# Patient Record
Sex: Male | Born: 1995 | Race: White | Hispanic: No | Marital: Single | Smoking: Current some day smoker
Health system: Southern US, Community
[De-identification: ages and names within clinical notes are randomized; demographics above are authoritative.]

## PROBLEM LIST (undated history)

## (undated) ENCOUNTER — Emergency Department (HOSPITAL_COMMUNITY): Payer: Self-pay

## (undated) DIAGNOSIS — T883XXA Malignant hyperthermia due to anesthesia, initial encounter: Secondary | ICD-10-CM

---
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
## 2015-11-07 ENCOUNTER — Emergency Department (HOSPITAL_COMMUNITY): Payer: Managed Care, Other (non HMO)

## 2015-11-07 ENCOUNTER — Inpatient Hospital Stay (HOSPITAL_COMMUNITY): Payer: Managed Care, Other (non HMO) | Admitting: Certified Registered"

## 2015-11-07 ENCOUNTER — Encounter (HOSPITAL_COMMUNITY): Payer: Self-pay | Admitting: Physical Medicine and Rehabilitation

## 2015-11-07 ENCOUNTER — Encounter (HOSPITAL_COMMUNITY)
Admission: EM | Disposition: A | Discharge status: Home Health | Payer: Self-pay | Source: Home / Self Care | Attending: Orthopedic Surgery

## 2015-11-07 ENCOUNTER — Inpatient Hospital Stay (HOSPITAL_COMMUNITY)
Admission: EM | Admit: 2015-11-07 | Discharge: 2015-11-13 | DRG: 481 | Disposition: A | Discharge status: Home Health | Payer: Managed Care, Other (non HMO) | Attending: Orthopedic Surgery | Admitting: Orthopedic Surgery

## 2015-11-07 DIAGNOSIS — T402X5A Adverse effect of other opioids, initial encounter: Secondary | ICD-10-CM | POA: Diagnosis not present

## 2015-11-07 DIAGNOSIS — W132XXA Fall from, out of or through roof, initial encounter: Secondary | ICD-10-CM | POA: Diagnosis present

## 2015-11-07 DIAGNOSIS — M25551 Pain in right hip: Secondary | ICD-10-CM | POA: Diagnosis present

## 2015-11-07 DIAGNOSIS — K5903 Drug induced constipation: Secondary | ICD-10-CM | POA: Diagnosis not present

## 2015-11-07 DIAGNOSIS — D62 Acute posthemorrhagic anemia: Secondary | ICD-10-CM | POA: Diagnosis not present

## 2015-11-07 DIAGNOSIS — S7221XA Displaced subtrochanteric fracture of right femur, initial encounter for closed fracture: Principal | ICD-10-CM | POA: Diagnosis present

## 2015-11-07 DIAGNOSIS — S72001G Fracture of unspecified part of neck of right femur, subsequent encounter for closed fracture with delayed healing: Secondary | ICD-10-CM

## 2015-11-07 DIAGNOSIS — Z01818 Encounter for other preprocedural examination: Secondary | ICD-10-CM

## 2015-11-07 DIAGNOSIS — F17210 Nicotine dependence, cigarettes, uncomplicated: Secondary | ICD-10-CM | POA: Diagnosis present

## 2015-11-07 DIAGNOSIS — W19XXXA Unspecified fall, initial encounter: Secondary | ICD-10-CM

## 2015-11-07 DIAGNOSIS — S7290XA Unspecified fracture of unspecified femur, initial encounter for closed fracture: Secondary | ICD-10-CM | POA: Diagnosis present

## 2015-11-07 DIAGNOSIS — Z23 Encounter for immunization: Secondary | ICD-10-CM

## 2015-11-07 DIAGNOSIS — M79604 Pain in right leg: Secondary | ICD-10-CM

## 2015-11-07 DIAGNOSIS — Z7409 Other reduced mobility: Secondary | ICD-10-CM

## 2015-11-07 DIAGNOSIS — R39198 Other difficulties with micturition: Secondary | ICD-10-CM | POA: Diagnosis not present

## 2015-11-07 DIAGNOSIS — S7291XA Unspecified fracture of right femur, initial encounter for closed fracture: Secondary | ICD-10-CM | POA: Diagnosis present

## 2015-11-07 HISTORY — PX: INTRAMEDULLARY (IM) NAIL INTERTROCHANTERIC: SHX5875

## 2015-11-07 HISTORY — DX: Malignant hyperthermia due to anesthesia, initial encounter: T88.3XXA

## 2015-11-07 LAB — CBC WITH DIFFERENTIAL/PLATELET
BASOS PCT: 0 %
Basophils Absolute: 0 10*3/uL (ref 0.0–0.1)
EOS ABS: 0.1 10*3/uL (ref 0.0–0.7)
EOS PCT: 1 %
HCT: 45.5 % (ref 39.0–52.0)
HEMOGLOBIN: 15.9 g/dL (ref 13.0–17.0)
Lymphocytes Relative: 33 %
Lymphs Abs: 3 10*3/uL (ref 0.7–4.0)
MCH: 31.2 pg (ref 26.0–34.0)
MCHC: 34.9 g/dL (ref 30.0–36.0)
MCV: 89.2 fL (ref 78.0–100.0)
MONO ABS: 0.6 10*3/uL (ref 0.1–1.0)
MONOS PCT: 7 %
NEUTROS PCT: 59 %
Neutro Abs: 5.3 10*3/uL (ref 1.7–7.7)
PLATELETS: 194 10*3/uL (ref 150–400)
RBC: 5.1 MIL/uL (ref 4.22–5.81)
RDW: 12.2 % (ref 11.5–15.5)
WBC: 9 10*3/uL (ref 4.0–10.5)

## 2015-11-07 LAB — PROTIME-INR
INR: 1.14
PROTHROMBIN TIME: 14.6 s (ref 11.4–15.2)

## 2015-11-07 LAB — COMPREHENSIVE METABOLIC PANEL
ALBUMIN: 4.2 g/dL (ref 3.5–5.0)
ALT: 20 U/L (ref 17–63)
ANION GAP: 9 (ref 5–15)
AST: 30 U/L (ref 15–41)
Alkaline Phosphatase: 50 U/L (ref 38–126)
BUN: 13 mg/dL (ref 6–20)
CHLORIDE: 106 mmol/L (ref 101–111)
CO2: 23 mmol/L (ref 22–32)
Calcium: 9.5 mg/dL (ref 8.9–10.3)
Creatinine, Ser: 0.91 mg/dL (ref 0.61–1.24)
GFR calc Af Amer: >60 mL/min (ref >60)
GFR calc non Af Amer: >60 mL/min (ref >60)
GLUCOSE: 103 mg/dL — AB (ref 65–99)
POTASSIUM: 3.8 mmol/L (ref 3.5–5.1)
SODIUM: 138 mmol/L (ref 135–145)
Total Bilirubin: 0.8 mg/dL (ref 0.3–1.2)
Total Protein: 6.8 g/dL (ref 6.5–8.1)

## 2015-11-07 LAB — I-STAT CG4 LACTIC ACID, ED
Lactic Acid, Venous: 2.26 mmol/L (ref 0.5–1.9)
Lactic Acid, Venous: 0.83 mmol/L (ref 0.5–1.9)

## 2015-11-07 SURGERY — FIXATION, FRACTURE, INTERTROCHANTERIC, WITH INTRAMEDULLARY ROD
Anesthesia: General | Site: Leg Upper | Laterality: Right

## 2015-11-07 MED ORDER — PHENYLEPHRINE 40 MCG/ML (10ML) SYRINGE FOR IV PUSH (FOR BLOOD PRESSURE SUPPORT)
PREFILLED_SYRINGE | INTRAVENOUS | Status: AC
  Filled 2015-11-07: qty 10

## 2015-11-07 MED ORDER — MIDAZOLAM HCL 2 MG/2ML IJ SOLN
INTRAMUSCULAR | Status: AC
  Filled 2015-11-07: qty 2

## 2015-11-07 MED ORDER — HYDROMORPHONE HCL 1 MG/ML IJ SOLN
1.0000 mg | INTRAMUSCULAR | Status: DC | PRN
  Administered 2015-11-07 (×2): 1 mg via INTRAVENOUS
  Filled 2015-11-07 (×2): qty 1

## 2015-11-07 MED ORDER — IOPAMIDOL (ISOVUE-300) INJECTION 61%
INTRAVENOUS | Status: AC
  Filled 2015-11-07: qty 100

## 2015-11-07 MED ORDER — ONDANSETRON HCL 4 MG PO TABS: 4.0000 mg | ORAL_TABLET | Freq: Four times a day (QID) | ORAL | Status: DC | PRN

## 2015-11-07 MED ORDER — FENTANYL CITRATE (PF) 100 MCG/2ML IJ SOLN
INTRAMUSCULAR | Status: DC | PRN
  Administered 2015-11-07: 100 ug via INTRAVENOUS
  Administered 2015-11-07 – 2015-11-08 (×6): 50 ug via INTRAVENOUS

## 2015-11-07 MED ORDER — ACETAMINOPHEN 10 MG/ML IV SOLN
INTRAVENOUS | Status: DC | PRN
  Administered 2015-11-07: 1000 mg via INTRAVENOUS

## 2015-11-07 MED ORDER — ONDANSETRON HCL 4 MG/2ML IJ SOLN
4.0000 mg | Freq: Once | INTRAMUSCULAR | Status: AC
  Administered 2015-11-07: 4 mg via INTRAVENOUS
  Filled 2015-11-07: qty 2

## 2015-11-07 MED ORDER — MAGNESIUM CITRATE PO SOLN: 1.0000 | Freq: Once | ORAL | Status: DC | PRN

## 2015-11-07 MED ORDER — FENTANYL CITRATE (PF) 100 MCG/2ML IJ SOLN
INTRAMUSCULAR | Status: AC
  Filled 2015-11-07: qty 2

## 2015-11-07 MED ORDER — FENTANYL CITRATE (PF) 100 MCG/2ML IJ SOLN
25.0000 ug | INTRAMUSCULAR | Status: DC | PRN
  Administered 2015-11-08: 50 ug via INTRAVENOUS

## 2015-11-07 MED ORDER — ACETAMINOPHEN 650 MG RE SUPP: 650.0000 mg | Freq: Four times a day (QID) | RECTAL | Status: DC | PRN

## 2015-11-07 MED ORDER — 0.9 % SODIUM CHLORIDE (POUR BTL) OPTIME
TOPICAL | Status: DC | PRN
  Administered 2015-11-07: 1000 mL

## 2015-11-07 MED ORDER — OXYCODONE HCL 5 MG PO TABS: 5.0000 mg | ORAL_TABLET | ORAL | Status: DC | PRN

## 2015-11-07 MED ORDER — ACETAMINOPHEN 10 MG/ML IV SOLN
INTRAVENOUS | Status: AC
  Filled 2015-11-07: qty 100

## 2015-11-07 MED ORDER — SUCCINYLCHOLINE CHLORIDE 200 MG/10ML IV SOSY
PREFILLED_SYRINGE | INTRAVENOUS | Status: AC
  Filled 2015-11-07: qty 10

## 2015-11-07 MED ORDER — PROPOFOL 500 MG/50ML IV EMUL
INTRAVENOUS | Status: DC | PRN
  Administered 2015-11-07: 125 ug/kg/min via INTRAVENOUS

## 2015-11-07 MED ORDER — ROCURONIUM BROMIDE 10 MG/ML (PF) SYRINGE
PREFILLED_SYRINGE | INTRAVENOUS | Status: AC
  Filled 2015-11-07: qty 10

## 2015-11-07 MED ORDER — LIDOCAINE HCL (CARDIAC) 20 MG/ML IV SOLN
INTRAVENOUS | Status: DC | PRN
  Administered 2015-11-07: 100 mg via INTRAVENOUS

## 2015-11-07 MED ORDER — CEFAZOLIN SODIUM-DEXTROSE 2-4 GM/100ML-% IV SOLN
2.0000 g | Freq: Once | INTRAVENOUS | Status: DC
  Filled 2015-11-07: qty 100

## 2015-11-07 MED ORDER — PROPOFOL 10 MG/ML IV BOLUS
INTRAVENOUS | Status: DC | PRN
  Administered 2015-11-07: 100 mg via INTRAVENOUS
  Administered 2015-11-07: 50 mg via INTRAVENOUS

## 2015-11-07 MED ORDER — PROMETHAZINE HCL 25 MG/ML IJ SOLN: 6.2500 mg | INTRAMUSCULAR | Status: DC | PRN

## 2015-11-07 MED ORDER — SUGAMMADEX SODIUM 200 MG/2ML IV SOLN
INTRAVENOUS | Status: AC
  Filled 2015-11-07: qty 2

## 2015-11-07 MED ORDER — LACTATED RINGERS IV SOLN
INTRAVENOUS | Status: DC | PRN
  Administered 2015-11-07 – 2015-11-08 (×3): via INTRAVENOUS

## 2015-11-07 MED ORDER — DOCUSATE SODIUM 100 MG PO CAPS: 100.0000 mg | ORAL_CAPSULE | Freq: Two times a day (BID) | ORAL | Status: DC

## 2015-11-07 MED ORDER — ONDANSETRON HCL 4 MG/2ML IJ SOLN: 4.0000 mg | Freq: Four times a day (QID) | INTRAMUSCULAR | Status: DC | PRN

## 2015-11-07 MED ORDER — EPHEDRINE 5 MG/ML INJ
INTRAVENOUS | Status: AC
  Filled 2015-11-07: qty 10

## 2015-11-07 MED ORDER — POLYETHYLENE GLYCOL 3350 17 G PO PACK: 17.0000 g | PACK | Freq: Every day | ORAL | Status: DC | PRN

## 2015-11-07 MED ORDER — ACETAMINOPHEN 325 MG PO TABS: 650.0000 mg | ORAL_TABLET | Freq: Four times a day (QID) | ORAL | Status: DC | PRN

## 2015-11-07 MED ORDER — ROCURONIUM BROMIDE 100 MG/10ML IV SOLN
INTRAVENOUS | Status: DC | PRN
  Administered 2015-11-07: 50 mg via INTRAVENOUS
  Administered 2015-11-07 – 2015-11-08 (×5): 10 mg via INTRAVENOUS

## 2015-11-07 MED ORDER — CEFAZOLIN SODIUM-DEXTROSE 2-3 GM-% IV SOLR
INTRAVENOUS | Status: DC | PRN
  Administered 2015-11-07: 2 g via INTRAVENOUS

## 2015-11-07 MED ORDER — BISACODYL 5 MG PO TBEC: 5.0000 mg | DELAYED_RELEASE_TABLET | Freq: Every day | ORAL | Status: DC | PRN

## 2015-11-07 MED ORDER — SODIUM CHLORIDE 0.9 % IV BOLUS (SEPSIS)
1000.0000 mL | Freq: Once | INTRAVENOUS | Status: AC
  Administered 2015-11-07: 1000 mL via INTRAVENOUS

## 2015-11-07 MED ORDER — MIDAZOLAM HCL 5 MG/5ML IJ SOLN
INTRAMUSCULAR | Status: DC | PRN
  Administered 2015-11-07: 2 mg via INTRAVENOUS

## 2015-11-07 MED ORDER — CEFAZOLIN SODIUM 1 G IJ SOLR
INTRAMUSCULAR | Status: AC
  Filled 2015-11-07: qty 20

## 2015-11-07 MED ORDER — ARTIFICIAL TEARS OP OINT
TOPICAL_OINTMENT | OPHTHALMIC | Status: AC
  Filled 2015-11-07: qty 3.5

## 2015-11-07 SURGICAL SUPPLY — 48 items
BIT DRILL SHORT 4.2 (BIT) ×1 IMPLANT
BLADE 15 SAFETY STRL DISP (BLADE) ×6 IMPLANT
COVER PERINEAL POST (MISCELLANEOUS) ×3 IMPLANT
COVER SURGICAL LIGHT HANDLE (MISCELLANEOUS) ×3 IMPLANT
DRAPE STERI IOBAN 125X83 (DRAPES) ×3 IMPLANT
DRAPE U-SHAPE 47X51 STRL (DRAPES) ×6 IMPLANT
DRILL BIT SHORT 4.2 (BIT) ×2
DRSG MEPILEX BORDER 4X4 (GAUZE/BANDAGES/DRESSINGS) ×6 IMPLANT
DRSG MEPILEX BORDER 4X8 (GAUZE/BANDAGES/DRESSINGS) ×3 IMPLANT
ELECT PENCIL ROCKER SW 15FT (MISCELLANEOUS) ×3 IMPLANT
ELECT REM PT RETURN 9FT ADLT (ELECTROSURGICAL) ×3
ELECTRODE REM PT RTRN 9FT ADLT (ELECTROSURGICAL) ×1 IMPLANT
GLOVE BIO SURGEON STRL SZ 6.5 (GLOVE) ×4 IMPLANT
GLOVE BIO SURGEON STRL SZ7 (GLOVE) ×3 IMPLANT
GLOVE BIO SURGEONS STRL SZ 6.5 (GLOVE) ×2
GLOVE BIOGEL PI IND STRL 7.0 (GLOVE) ×1 IMPLANT
GLOVE BIOGEL PI IND STRL 8.5 (GLOVE) ×1 IMPLANT
GLOVE BIOGEL PI INDICATOR 7.0 (GLOVE) ×2
GLOVE BIOGEL PI INDICATOR 8.5 (GLOVE) ×2
GLOVE SS BIOGEL STRL SZ 8.5 (GLOVE) ×2 IMPLANT
GLOVE SUPERSENSE BIOGEL SZ 8.5 (GLOVE) ×4
GOWN STRL REUS W/ TWL LRG LVL3 (GOWN DISPOSABLE) ×1 IMPLANT
GOWN STRL REUS W/TWL 2XL LVL3 (GOWN DISPOSABLE) ×6 IMPLANT
GOWN STRL REUS W/TWL LRG LVL3 (GOWN DISPOSABLE) ×2
KIT BASIN OR (CUSTOM PROCEDURE TRAY) ×3 IMPLANT
KIT ROOM TURNOVER OR (KITS) ×3 IMPLANT
LINER BOOT UNIVERSAL DISP (MISCELLANEOUS) ×3 IMPLANT
MANIFOLD NEPTUNE II (INSTRUMENTS) ×3 IMPLANT
NAIL 10/130 TI CAN TFNA 360HIP (Orthopedic Implant) ×3 IMPLANT
NS IRRIG 1000ML POUR BTL (IV SOLUTION) ×3 IMPLANT
PACK GENERAL/GYN (CUSTOM PROCEDURE TRAY) ×3 IMPLANT
PAD ARMBOARD 7.5X6 YLW CONV (MISCELLANEOUS) ×6 IMPLANT
REAMER ROD DEEP FLUTE 2.5X950 (INSTRUMENTS) ×3 IMPLANT
SCREW LOCK 5.0MMX52NN (Screw) ×3 IMPLANT
SCREW LOCKING 5.0MMX40MM (Screw) ×3 IMPLANT
SCREW TFNA 105MM STERILE (Screw) ×3 IMPLANT
SPONGE GAUZE 4X4 12PLY STER LF (GAUZE/BANDAGES/DRESSINGS) ×9 IMPLANT
STAPLER VISISTAT 35W (STAPLE) IMPLANT
SURGIFLO W/THROMBIN 8M KIT (HEMOSTASIS) IMPLANT
SUT BONE WAX W31G (SUTURE) ×3 IMPLANT
SUT VIC AB 0 CT1 27 (SUTURE) ×2
SUT VIC AB 0 CT1 27XBRD ANBCTR (SUTURE) ×1 IMPLANT
SUT VIC AB 1 CT1 27 (SUTURE) ×2
SUT VIC AB 1 CT1 27XBRD ANBCTR (SUTURE) ×1 IMPLANT
SUT VIC AB 2-0 CT1 18 (SUTURE) ×6 IMPLANT
SUT VIC AB 2-0 CT2 18 VCP726D (SUTURE) ×3 IMPLANT
SUT VIC AB 2-0 CTB1 (SUTURE) ×3 IMPLANT
WATER STERILE IRR 1000ML POUR (IV SOLUTION) ×6 IMPLANT

## 2015-11-07 NOTE — ED Notes (Signed)
Patient pale, diaphoretic stating he is nauseated. Family at bedside

## 2015-11-07 NOTE — Anesthesia Preprocedure Evaluation (Signed)
Anesthesia Evaluation  Patient identified by MRN, date of birth, ID band Patient awake    Reviewed: Allergy & Precautions, NPO status , Patient's Chart, lab work & pertinent test results  History of Anesthesia Complications (+) Family history of anesthesia reaction and history of anesthetic complications (Dad with Malignant Hyperthermia)  Airway Mallampati: II  TM Distance: >3 FB Neck ROM: Full    Dental  (+) Teeth Intact, Dental Advisory Given   Pulmonary Current Smoker,    Pulmonary exam normal breath sounds clear to auscultation       Cardiovascular Exercise Tolerance: Good negative cardio ROS Normal cardiovascular exam Rhythm:Regular Rate:Normal     Neuro/Psych negative neurological ROS  negative psych ROS   GI/Hepatic negative GI ROS, (+)     substance abuse  marijuana use,   Endo/Other  negative endocrine ROS  Renal/GU negative Renal ROS     Musculoskeletal negative musculoskeletal ROS (+)   Abdominal   Peds  Hematology negative hematology ROS (+)   Anesthesia Other Findings Day of surgery medications reviewed with the patient.  Reproductive/Obstetrics                             Anesthesia Physical Anesthesia Plan  ASA: II  Anesthesia Plan: General   Post-op Pain Management:    Induction: Intravenous  Airway Management Planned: Oral ETT  Additional Equipment:   Intra-op Plan:   Post-operative Plan: Extubation in OR  Informed Consent: I have reviewed the patients History and Physical, chart, labs and discussed the procedure including the risks, benefits and alternatives for the proposed anesthesia with the patient or authorized representative who has indicated his/her understanding and acceptance.   Dental advisory given  Plan Discussed with: CRNA  Anesthesia Plan Comments: (Risks/benefits of general anesthesia discussed with patient including risk of damage to  teeth, lips, gum, and tongue, nausea/vomiting, allergic reactions to medications, and the possibility of heart attack, stroke and death.  All patient/patient representative questions answered.  Patient/patient representative wishes to proceed.  MALIGNANT HYPERTHERMIA PRECAUTIONS--use non-triggering agents and TIVA.)        Anesthesia Quick Evaluation

## 2015-11-07 NOTE — ED Notes (Signed)
Pt returned to exam room. 

## 2015-11-07 NOTE — ED Triage Notes (Addendum)
Pt to department via [facility] for evaluation of fall (4820ft) off roof while trying to get into house. C/o lower back pain and R hip/leg pain. c-collar and LSB upon arrival. R leg stabilized upon arrival. 18g LAC. Pt is alert and oriented x4. Received 200mcg Fentanyl. Denies LOC.

## 2015-11-07 NOTE — H&P (Addendum)
Patient ID: Bryan Monroe MRN: [id number] DOB/AGE: [birth year] 21 y.o.  Admit date: 11/07/2015  Admission Diagnoses:  Proximal Comminuted Femur Verdia Kuba  HPI: Pleasant 20 year old male pt who is usually in a state of good health.  Pt reports a fall from a roof over 2 stories high. He reports landing on his right hip.  He reports being brought to the hospital by ambulance.  Pt was unable to bear weight on his RLE.  He reports exquisite pain of his right hip.  Pt denies having eaten today.  He denies being on any chronic medication.    Past Medical History: History reviewed. No pertinent past medical history.  Surgical History: History reviewed. No pertinent surgical history.  Family History: No family history on file.  Social History: Social History   Social History  . Marital status: Single    Spouse name: N/A  . Number of children: N/A  . Years of education: N/A   Occupational History  . Not on file.   Social History Main Topics  . Smoking status: Current Some Day Smoker    Types: Cigarettes  . Smokeless tobacco: Never Used  . Alcohol use Yes     Comment: social  . Drug use:     Types: Marijuana  . Sexual activity: Not on file   Other Topics Concern  . Not on file   Social History Narrative  . No narrative on file    Allergies: Review of patient's allergies indicates no known allergies.  Medications: I have reviewed the patient's current medications.  Vital Signs: Patient Vitals for the past 24 hrs:  BP Temp Temp src Pulse Resp SpO2  11/07/15 1523 104/71 - - 63 18 100 %  11/07/15 1417 132/78 - - 62 18 100 %  11/07/15 1310 119/64 - - 70 18 100 %  11/07/15 1255 111/82 98.7 F (37.1 C) Oral 69 18 99 %    Radiology: Dg Knee 1-2 Views Right  Result Date: 11/07/2015 CLINICAL DATA:  Initial evaluation for acute trauma, fall. EXAM: RIGHT KNEE - 1-2 VIEW COMPARISON:  None. FINDINGS: No evidence of fracture, dislocation, or joint effusion. No evidence of  arthropathy or other focal bone abnormality. Soft tissues are unremarkable. External traction device in place. IMPRESSION: Negative. Electronically Signed   By: Rise Mu M.D.   On: 11/07/2015 14:16   Dg Tibia/fibula Right  Result Date: 11/07/2015 CLINICAL DATA:  Initial evaluation for acute trauma, fall. EXAM: RIGHT TIBIA AND FIBULA - 2 VIEW COMPARISON:  None. FINDINGS: There is no evidence of fracture or other focal bone lesions. Soft tissues are unremarkable. External traction device in place. IMPRESSION: Negative. Electronically Signed   By: Rise Mu M.D.   On: 11/07/2015 14:31   Ct Abdomen Pelvis W Contrast  Result Date: 11/07/2015 CLINICAL DATA:  Fall 20 foot from roof.  Right hip pain. EXAM: CT ABDOMEN AND PELVIS WITH CONTRAST TECHNIQUE: Multidetector CT imaging of the abdomen and pelvis was performed using the standard protocol following bolus administration of intravenous contrast. CONTRAST:  100 cc Isovue 300 IV COMPARISON:  None. FINDINGS: Lower chest: Lung bases are clear. No effusions. Heart is normal size. Hepatobiliary: No focal hepatic abnormality. Gallbladder unremarkable. Pancreas: No focal abnormality or ductal dilatation. Spleen: No focal abnormality.  Normal size. Adrenals/Urinary Tract: No adrenal abnormality. No focal renal abnormality. No stones or hydronephrosis. Urinary bladder is unremarkable. Stomach/Bowel: Stomach, large and small bowel grossly unremarkable. Vascular/Lymphatic: No evidence of aneurysm or adenopathy. Reproductive: No  visible focal abnormality. Other: No free fluid or free air. Musculoskeletal: Comminuted fracture noted through the proximal right femur as seen on plain film. No additional acute bony abnormality. IMPRESSION: Comminuted proximal right femoral fracture. No acute intra-abdominal abnormality. Electronically Signed   By: Charlett Nose M.D.   On: 11/07/2015 14:24   Dg Pelvis Portable  Result Date: 11/07/2015 CLINICAL DATA:   20 year old male with pelvic pain following fall. Initial encounter. EXAM: PORTABLE PELVIS 1-2 VIEWS COMPARISON:  None. FINDINGS: An oblique fracture of the proximal right femur with apex lateral angulation noted. This fracture extends inferiorly and laterally from the lesser trochanter. A probable nondisplaced fracture of the right inferior pubic ramus is noted. No subluxation, dislocation or diastases noted. IMPRESSION: Proximal right femur fracture as described. Question nondisplaced right inferior pubic ramus fracture. Electronically Signed   By: Harmon Pier M.D.   On: 11/07/2015 13:26   Dg Chest Portable 1 View  Result Date: 11/07/2015 CLINICAL DATA:  Fall off roof EXAM: PORTABLE CHEST 1 VIEW COMPARISON:  None. FINDINGS: Lungs are clear.  No pleural effusion or pneumothorax. The heart is normal in size. Visualized osseous structures are within normal limits. IMPRESSION: No evidence of acute cardiopulmonary disease. Electronically Signed   By: Charline Bills M.D.   On: 11/07/2015 13:25   Dg Hip Unilat With Pelvis 2-3 Views Right  Result Date: 11/07/2015 CLINICAL DATA:  Fall 20 feet, landing on right hip. EXAM: DG HIP (WITH OR WITHOUT PELVIS) 2-3V RIGHT COMPARISON:  None. FINDINGS: Comminuted fracture noted through the proximal right femoral shaft involving the lesser trochanter. Varus angulation and displacement of fracture fragments. Fracture through the right inferior pubic ramus, nondisplaced. No subluxation or dislocation. IMPRESSION: Comminuted, angulated and displaced fracture through the proximal right femoral shaft involving the lesser trochanter. Nondisplaced fracture through the right inferior pubic ramus. Electronically Signed   By: Charlett Nose M.D.   On: 11/07/2015 14:07   Dg Femur Min 2 Views Right  Result Date: 11/07/2015 CLINICAL DATA:  Initial evaluation for acute trauma, fall. EXAM: RIGHT FEMUR 2 VIEWS COMPARISON:  None. FINDINGS: External traction device in place. There is an  acute mildly comminuted oblique fracture through the intertrochanteric region of the left hip with sub trochanteric extension. Fracture line oriented longitudinally into the long axis of the femur. Slight medial and posterior displacement. Right femoral head normally position within the acetabulum. No discrete acetabular fracture identified. Linear lucency through the right inferior pubic ramus suspicious for possible fracture as well. Pubis symphysis approximated. Visualized SI joints approximated. The IMPRESSION: 1. Acute comminuted oblique intertrochanteric right femoral fracture. 2. Linear lucency through the right inferior pubic ramus, suspicious for acute nondisplaced fracture. Electronically Signed   By: Rise Mu M.D.   On: 11/07/2015 14:15    Labs:  Recent Labs  11/07/15 1314  WBC 9.0  RBC 5.10  HCT 45.5  PLT 194    Recent Labs  11/07/15 1314  NA 138  K 3.8  CL 106  CO2 23  BUN 13  CREATININE 0.91  GLUCOSE 103*  CALCIUM 9.5    Recent Labs  11/07/15 1314  INR 1.14    Review of Systems: ROS  Physical Exam: Neurologically intact ABD soft Neurovascular intact Sensation intact distally Intact pulses distally  Edema noted of proximal femur TTP of anterior thigh Pt has movement of b/l feet RLE currently in traction  Assessment and Plan: Reviewed the pts imaging - surgical intervention recommended Consulted with Dr. Shon Baton Pt should remain NPO  10 lbs bucks traction CT of pelvis/ right femur   Anette Riedelarmen Mayo for Venita Lickahari Fallou Hulbert, MD [facility] [phone] ID: Pura SpiceNicholas Goodnow, male   DOB: 11-08-1995, 20 y.o.   MRN: [id number]     Agree with above Closed prox femur fx (Sub-troch) right side NVI, compartments soft/nt Plan on IM nail fixation with troch entry nail Risks explained - infection, bleeding, nerve damage, post-traumatic arthritis, need for additional surgery, non-union, mal-union, hardware failure, blood clots.

## 2015-11-07 NOTE — ED Notes (Signed)
Nurse unable to take report at the time. Will call back.

## 2015-11-07 NOTE — ED Notes (Signed)
Obvious deformity, swelling and pain noted to R upper leg. Pulses present to R leg and foot. Sensation intact.

## 2015-11-07 NOTE — ED Notes (Signed)
Pt to be moved to unit after 7pm per Ron-A/C due to staffing issues.

## 2015-11-07 NOTE — ED Notes (Signed)
Spoke with Dr. Silverio LayYao, to write temporary holding orders after he speaks with ortho.

## 2015-11-07 NOTE — ED Notes (Signed)
Pt transported to xray 

## 2015-11-07 NOTE — Anesthesia Procedure Notes (Signed)
Procedure Name: Intubation Date/Time: 11/07/2015 10:13 PM Performed by: Arlice ColtMANESS, Benjerman Molinelli B Pre-anesthesia Checklist: Patient identified, Emergency Drugs available, Suction available, Patient being monitored and Timeout performed Patient Re-evaluated:Patient Re-evaluated prior to inductionOxygen Delivery Method: Circle system utilized Preoxygenation: Pre-oxygenation with 100% oxygen Intubation Type: IV induction Ventilation: Mask ventilation without difficulty Laryngoscope Size: Mac and 3 Grade View: Grade I Tube type: Oral Tube size: 7.5 mm Number of attempts: 1 Airway Equipment and Method: Stylet Placement Confirmation: ETT inserted through vocal cords under direct vision,  positive ETCO2 and breath sounds checked- equal and bilateral Secured at: 22 cm Tube secured with: Tape Dental Injury: Teeth and Oropharynx as per pre-operative assessment

## 2015-11-07 NOTE — ED Notes (Signed)
Lab at bedside

## 2015-11-07 NOTE — Consult Note (Signed)
Reason for Consult: fall Referring Physician: [name] is an 20 y.o. male.  HPI: 20 year old male that jumped off of the two-story building. A multiple embankment's and pushes and finally landing on concrete. Complains of focal pain in his right hip. He did not lose consciousness. He did not hit his head on anything. He has no nausea or vomiting.  History reviewed. No pertinent past medical history.  History reviewed. No pertinent surgical history.  No family history on file.  Social History:  reports that he has been smoking Cigarettes.  He has never used smokeless tobacco. He reports that he drinks alcohol. He reports that he uses drugs, including Marijuana.  Allergies: No Known Allergies  Medications: I have reviewed the patient's current medications.  Results for orders placed or performed during the hospital encounter of 11/07/15 (from the past 48 hour(s))  CBC with Differential     Status: None   Collection Time: 11/07/15  1:14 PM  Result Value Ref Range   WBC 9.0 4.0 - 10.5 K/uL   RBC 5.10 4.22 - 5.81 MIL/uL   Hemoglobin 15.9 13.0 - 17.0 g/dL   HCT 45.5 39.0 - 52.0 %   MCV 89.2 78.0 - 100.0 fL   MCH 31.2 26.0 - 34.0 pg   MCHC 34.9 30.0 - 36.0 g/dL   RDW 12.2 11.5 - 15.5 %   Platelets 194 150 - 400 K/uL   Neutrophils Relative % 59 %   Neutro Abs 5.3 1.7 - 7.7 K/uL   Lymphocytes Relative 33 %   Lymphs Abs 3.0 0.7 - 4.0 K/uL   Monocytes Relative 7 %   Monocytes Absolute 0.6 0.1 - 1.0 K/uL   Eosinophils Relative 1 %   Eosinophils Absolute 0.1 0.0 - 0.7 K/uL   Basophils Relative 0 %   Basophils Absolute 0.0 0.0 - 0.1 K/uL  Comprehensive metabolic panel     Status: Abnormal   Collection Time: 11/07/15  1:14 PM  Result Value Ref Range   Sodium 138 135 - 145 mmol/L   Potassium 3.8 3.5 - 5.1 mmol/L   Chloride 106 101 - 111 mmol/L   CO2 23 22 - 32 mmol/L   Glucose, Bld 103 (H) 65 - 99 mg/dL   BUN 13 6 - 20 mg/dL   Creatinine, Ser 0.91 0.61 - 1.24  mg/dL   Calcium 9.5 8.9 - 10.3 mg/dL   Total Protein 6.8 6.5 - 8.1 g/dL   Albumin 4.2 3.5 - 5.0 g/dL   AST 30 15 - 41 U/L   ALT 20 17 - 63 U/L   Alkaline Phosphatase 50 38 - 126 U/L   Total Bilirubin 0.8 0.3 - 1.2 mg/dL   GFR calc non Af Amer >60 >60 mL/min   GFR calc Af Amer >60 >60 mL/min    Comment: (NOTE) The eGFR has been calculated using the CKD EPI equation. This calculation has not been validated in all clinical situations. eGFR's persistently <60 mL/min signify possible Chronic Kidney Disease.    Anion gap 9 5 - 15  Protime-INR     Status: None   Collection Time: 11/07/15  1:14 PM  Result Value Ref Range   Prothrombin Time 14.6 11.4 - 15.2 seconds   INR 1.14   I-Stat CG4 Lactic Acid, ED     Status: Abnormal   Collection Time: 11/07/15  1:31 PM  Result Value Ref Range   Lactic Acid, Venous 2.26 (HH) 0.5 - 1.9 mmol/L   Comment NOTIFIED PHYSICIAN  I-Stat CG4 Lactic Acid, ED     Status: None   Collection Time: 11/07/15  5:55 PM  Result Value Ref Range   Lactic Acid, Venous 0.83 0.5 - 1.9 mmol/L    Dg Knee 1-2 Views Right  Result Date: 11/07/2015 CLINICAL DATA:  Initial evaluation for acute trauma, fall. EXAM: RIGHT KNEE - 1-2 VIEW COMPARISON:  None. FINDINGS: No evidence of fracture, dislocation, or joint effusion. No evidence of arthropathy or other focal bone abnormality. Soft tissues are unremarkable. External traction device in place. IMPRESSION: Negative. Electronically Signed   By: Jeannine Boga M.D.   On: 11/07/2015 14:16   Dg Tibia/fibula Right  Result Date: 11/07/2015 CLINICAL DATA:  Initial evaluation for acute trauma, fall. EXAM: RIGHT TIBIA AND FIBULA - 2 VIEW COMPARISON:  None. FINDINGS: There is no evidence of fracture or other focal bone lesions. Soft tissues are unremarkable. External traction device in place. IMPRESSION: Negative. Electronically Signed   By: Jeannine Boga M.D.   On: 11/07/2015 14:31   Ct Abdomen Pelvis W Contrast  Result  Date: 11/07/2015 CLINICAL DATA:  Fall 20 foot from roof.  Right hip pain. EXAM: CT ABDOMEN AND PELVIS WITH CONTRAST TECHNIQUE: Multidetector CT imaging of the abdomen and pelvis was performed using the standard protocol following bolus administration of intravenous contrast. CONTRAST:  100 cc Isovue 300 IV COMPARISON:  None. FINDINGS: Lower chest: Lung bases are clear. No effusions. Heart is normal size. Hepatobiliary: No focal hepatic abnormality. Gallbladder unremarkable. Pancreas: No focal abnormality or ductal dilatation. Spleen: No focal abnormality.  Normal size. Adrenals/Urinary Tract: No adrenal abnormality. No focal renal abnormality. No stones or hydronephrosis. Urinary bladder is unremarkable. Stomach/Bowel: Stomach, large and small bowel grossly unremarkable. Vascular/Lymphatic: No evidence of aneurysm or adenopathy. Reproductive: No visible focal abnormality. Other: No free fluid or free air. Musculoskeletal: Comminuted fracture noted through the proximal right femur as seen on plain film. No additional acute bony abnormality. IMPRESSION: Comminuted proximal right femoral fracture. No acute intra-abdominal abnormality. Electronically Signed   By: Rolm Baptise M.D.   On: 11/07/2015 14:24   Dg Pelvis Portable  Result Date: 11/07/2015 CLINICAL DATA:  20 year old male with pelvic pain following fall. Initial encounter. EXAM: PORTABLE PELVIS 1-2 VIEWS COMPARISON:  None. FINDINGS: An oblique fracture of the proximal right femur with apex lateral angulation noted. This fracture extends inferiorly and laterally from the lesser trochanter. A probable nondisplaced fracture of the right inferior pubic ramus is noted. No subluxation, dislocation or diastases noted. IMPRESSION: Proximal right femur fracture as described. Question nondisplaced right inferior pubic ramus fracture. Electronically Signed   By: Margarette Canada M.D.   On: 11/07/2015 13:26   Ct Femur Right Wo Contrast  Result Date: 11/07/2015 CLINICAL  DATA:  Fall from roof today.  Right femur fracture. EXAM: CT OF THE RIGHT FEMUR WITHOUT CONTRAST TECHNIQUE: Multidetector CT imaging was performed according to the standard protocol. Multiplanar CT image reconstructions were also generated. COMPARISON:  Pelvic and right hip radiograph same date. FINDINGS: Bones/Joint/Cartilage There is a comminuted subtrochanteric femur fracture with intertrochanteric extension medially. There is medial displacement of the lesser trochanter. This fracture is mildly displaced posteriorly and laterally. There is apex lateral angulation and a 9.8 cm butterfly fragment posteriorly. The greater trochanter and femoral neck are intact. The femoral head is intact and located. There is a nondisplaced fracture of the right inferior pubic ramus. The superior pubic ramus appears intact. The distal right femur appears intact. Ligaments Not relevant for exam/indication. Muscles and Tendons  Contusion within the quadriceps musculature proximally. No large hematoma or other focal fluid collection identified. No evidence of tendon rupture by CT. Soft Tissues None. IMPRESSION: 1. Comminuted subtrochanteric right femur fracture with medial intertrochanteric extension. This fracture is mildly displaced and angulated. 2. Nondisplaced fracture of the right inferior pubic ramus. Electronically Signed   By: Richardean Sale M.D.   On: 11/07/2015 17:18   Dg Chest Portable 1 View  Result Date: 11/07/2015 CLINICAL DATA:  Fall off roof EXAM: PORTABLE CHEST 1 VIEW COMPARISON:  None. FINDINGS: Lungs are clear.  No pleural effusion or pneumothorax. The heart is normal in size. Visualized osseous structures are within normal limits. IMPRESSION: No evidence of acute cardiopulmonary disease. Electronically Signed   By: Julian Hy M.D.   On: 11/07/2015 13:25   Dg Hip Unilat With Pelvis 2-3 Views Right  Result Date: 11/07/2015 CLINICAL DATA:  Fall 20 feet, landing on right hip. EXAM: DG HIP (WITH OR  WITHOUT PELVIS) 2-3V RIGHT COMPARISON:  None. FINDINGS: Comminuted fracture noted through the proximal right femoral shaft involving the lesser trochanter. Varus angulation and displacement of fracture fragments. Fracture through the right inferior pubic ramus, nondisplaced. No subluxation or dislocation. IMPRESSION: Comminuted, angulated and displaced fracture through the proximal right femoral shaft involving the lesser trochanter. Nondisplaced fracture through the right inferior pubic ramus. Electronically Signed   By: Rolm Baptise M.D.   On: 11/07/2015 14:07   Dg Femur Min 2 Views Right  Result Date: 11/07/2015 CLINICAL DATA:  Initial evaluation for acute trauma, fall. EXAM: RIGHT FEMUR 2 VIEWS COMPARISON:  None. FINDINGS: External traction device in place. There is an acute mildly comminuted oblique fracture through the intertrochanteric region of the left hip with sub trochanteric extension. Fracture line oriented longitudinally into the long axis of the femur. Slight medial and posterior displacement. Right femoral head normally position within the acetabulum. No discrete acetabular fracture identified. Linear lucency through the right inferior pubic ramus suspicious for possible fracture as well. Pubis symphysis approximated. Visualized SI joints approximated. The IMPRESSION: 1. Acute comminuted oblique intertrochanteric right femoral fracture. 2. Linear lucency through the right inferior pubic ramus, suspicious for acute nondisplaced fracture. Electronically Signed   By: Jeannine Boga M.D.   On: 11/07/2015 14:15    Review of Systems  Constitutional: Negative for chills and fever.  HENT: Negative for hearing loss.   Eyes: Negative for blurred vision and double vision.  Respiratory: Negative for cough and hemoptysis.   Cardiovascular: Negative for chest pain and palpitations.  Gastrointestinal: Negative for abdominal pain, nausea and vomiting.  Genitourinary: Negative for dysuria and  urgency.  Musculoskeletal: Positive for joint pain. Negative for myalgias and neck pain.  Skin: Negative for itching and rash.  Neurological: Negative for dizziness, tingling and headaches.  Endo/Heme/Allergies: Does not bruise/bleed easily.  Psychiatric/Behavioral: Negative for depression and suicidal ideas.   Blood pressure 128/80, pulse 65, temperature 98.7 F (37.1 C), temperature source Oral, resp. rate 18, height _0  (1.651 m), weight 77.1 kg (170 lb), SpO2 99 %. Physical Exam  Vitals reviewed. Constitutional: He is oriented to person, place, and time. He appears well-developed and well-nourished.  HENT:  Head: Normocephalic and atraumatic.  Eyes: Conjunctivae and EOM are normal. Pupils are equal, round, and reactive to light.  Neck: Normal range of motion. Neck supple.  Cardiovascular: Normal rate, regular rhythm and intact distal pulses.   Respiratory: Effort normal and breath sounds normal.  GI: Soft. Bowel sounds are normal. He exhibits no distension. There  is no tenderness.  Musculoskeletal:  Right leg limited by pain otherwise normal range of motion in all extremities.  Neurological: He is alert and oriented to person, place, and time.  Sensation of both feet intact. Move feet and toes in both feet. Right leg movement limited by pain.  Skin: Skin is warm and dry.  Psychiatric: He has a normal mood and affect. His behavior is normal.    Assessment/Plan: 20 year old male fall 2 stories without loss of consciousness. Has complex right hip fracture. Cleared clinically, no signs of postconcussive syndrome. Abdomen without focal pain or peritoneal signs. -Orthopedics to admit -Will perform tertiary survey in the morning  Arta Bruce Kinsinger 11/07/2015, 8:10 PM

## 2015-11-07 NOTE — ED Provider Notes (Signed)
[facility]-EMERGENCY DEPT Provider Note   CSN: [id number] Arrival date & time: 11/07/15  1252    History   Chief Complaint Chief Complaint  Patient presents with  . Fall    HPI Bryan Monroe is a 20 y.o. male.  The history is provided by the patient and the EMS personnel.   20 yo M with no significant PMSHx presenting as a level 2 trauma after sustaining injury to his right leg after a fall.   Onset: fall was just PTA. Mechanical in nature. States lost balance and fell back from roof, height of around 20 ft. Landed on right hip, with sudden onset of pain following impact, then rolled to side. Denies LOC. Endorses occasional ETOH and some marijuana use but not today. EMS found pt hemodynamically stable, awake and alert, with femoral deformity and shortening of the leg. Placed in C collar with scoop stretcher and right leg traction.   Pain- located in right hip and leg. Radiating down upper part of leg. Described as throbbing. Severe. Worse with movement or palpation. Alleviated by fentanyl given by EMS en route. Associated with diminished sensation in heel of right foot, but otherwise distally no pain reported in knee/lower leg/ankle/foot and intact sensation in these areas.    History reviewed. No pertinent past medical history.  There are no active problems to display for this patient.   History reviewed. No pertinent surgical history.    Home Medications    Prior to Admission medications   Not on File    Family History No family history on file.  Social History Social History  Substance Use Topics  . Smoking status: Current Some Day Smoker    Types: Cigarettes  . Smokeless tobacco: Never Used  . Alcohol use Yes     Comment: social     Allergies   Review of patient's allergies indicates no known allergies.   Review of Systems Review of Systems  Constitutional: Negative for fever.  HENT: Negative for trouble swallowing.   Respiratory: Negative for shortness of  breath.   Cardiovascular: Negative for chest pain.  Gastrointestinal: Negative for abdominal pain and vomiting.  Genitourinary: Negative for decreased urine volume.  Musculoskeletal: Positive for arthralgias and joint swelling.  Skin: Positive for wound.  Allergic/Immunologic: Negative for immunocompromised state.  Neurological: Negative for syncope, weakness and headaches.  Hematological: Does not bruise/bleed easily.  Psychiatric/Behavioral: Negative for confusion.  All other systems reviewed and are negative.   Physical Exam Updated Vital Signs BP 104/71 (BP Location: Right Arm)   Pulse 63   Temp 98.7 F (37.1 C) (Oral)   Resp 18   SpO2 100%   Physical Exam  Nursing note and vitals reviewed. Gen: alert  Head: No skull depressions or lacerations.  ENT: Pupils 3 mm, equal, round, reactive to light. No conjunctival hemorrhage. No periorbital ecchymoses/racoons eyes or Battles sign bilaterally. Ears atraumatic. No hemotympanum. No nasal septal deviation or hematoma. Mouth and tongue atraumatic. Trachea midline. Superficial abrasion to bridge of nose Chest: Clavicles atraumatic, stable to anterior compression without crepitus. Chest wall with symmetric expansion, stable to anterior and lateral compression without crepitus.  Neck: no midline C spine tenderness, stepoffs, or deformities. C collar in place.  CV: RRR. Femoral, DP, and radial pulses 2+ and equal bilaterally. Abdomen: soft, nondistended, nontender. No abrasions/contusions.  GU: atraumatic, no gross blood.  Back: nontender to palpation of T and L spine, no stepoff or deformity  Neuro: moving all extremities. GCS: 15. Sensation intact to light touch  distally in all extremities.  Msk: Pelvis stable to anterior and lateral compression but with pain on palpation or compression of right hemipelvis. RLE with shortening, in traction; 2+ DP pulse and toes WWP, non tender at foot / ankle / lower leg / knee, but with high proximal  deformity and swelling in prox upper leg. Superficial abrasions throughout left leg and scattered on right leg.    ED Treatments / Results  Labs (all labs ordered are listed, but only abnormal results are displayed) Labs Reviewed  COMPREHENSIVE METABOLIC PANEL - Abnormal; Notable for the following:       Result Value   Glucose, Bld 103 (*)    All other components within normal limits  I-STAT CG4 LACTIC ACID, ED - Abnormal; Notable for the following:    Lactic Acid, Venous 2.26 (*)    All other components within normal limits  CBC WITH DIFFERENTIAL/PLATELET  PROTIME-INR  URINALYSIS, ROUTINE W REFLEX MICROSCOPIC (NOT AT [facility])  I-STAT CG4 LACTIC ACID, ED    EKG  EKG Interpretation None       Radiology Dg Knee 1-2 Views Right  Result Date: 11/07/2015 CLINICAL DATA:  Initial evaluation for acute trauma, fall. EXAM: RIGHT KNEE - 1-2 VIEW COMPARISON:  None. FINDINGS: No evidence of fracture, dislocation, or joint effusion. No evidence of arthropathy or other focal bone abnormality. Soft tissues are unremarkable. External traction device in place. IMPRESSION: Negative. Electronically Signed   By: Rise Mu M.D.   On: 11/07/2015 14:16   Dg Tibia/fibula Right  Result Date: 11/07/2015 CLINICAL DATA:  Initial evaluation for acute trauma, fall. EXAM: RIGHT TIBIA AND FIBULA - 2 VIEW COMPARISON:  None. FINDINGS: There is no evidence of fracture or other focal bone lesions. Soft tissues are unremarkable. External traction device in place. IMPRESSION: Negative. Electronically Signed   By: Rise Mu M.D.   On: 11/07/2015 14:31   Ct Abdomen Pelvis W Contrast  Result Date: 11/07/2015 CLINICAL DATA:  Fall 20 foot from roof.  Right hip pain. EXAM: CT ABDOMEN AND PELVIS WITH CONTRAST TECHNIQUE: Multidetector CT imaging of the abdomen and pelvis was performed using the standard protocol following bolus administration of intravenous contrast. CONTRAST:  100 cc Isovue 300 IV  COMPARISON:  None. FINDINGS: Lower chest: Lung bases are clear. No effusions. Heart is normal size. Hepatobiliary: No focal hepatic abnormality. Gallbladder unremarkable. Pancreas: No focal abnormality or ductal dilatation. Spleen: No focal abnormality.  Normal size. Adrenals/Urinary Tract: No adrenal abnormality. No focal renal abnormality. No stones or hydronephrosis. Urinary bladder is unremarkable. Stomach/Bowel: Stomach, large and small bowel grossly unremarkable. Vascular/Lymphatic: No evidence of aneurysm or adenopathy. Reproductive: No visible focal abnormality. Other: No free fluid or free air. Musculoskeletal: Comminuted fracture noted through the proximal right femur as seen on plain film. No additional acute bony abnormality. IMPRESSION: Comminuted proximal right femoral fracture. No acute intra-abdominal abnormality. Electronically Signed   By: Charlett Nose M.D.   On: 11/07/2015 14:24   Dg Pelvis Portable  Result Date: 11/07/2015 CLINICAL DATA:  20 year old male with pelvic pain following fall. Initial encounter. EXAM: PORTABLE PELVIS 1-2 VIEWS COMPARISON:  None. FINDINGS: An oblique fracture of the proximal right femur with apex lateral angulation noted. This fracture extends inferiorly and laterally from the lesser trochanter. A probable nondisplaced fracture of the right inferior pubic ramus is noted. No subluxation, dislocation or diastases noted. IMPRESSION: Proximal right femur fracture as described. Question nondisplaced right inferior pubic ramus fracture. Electronically Signed   By: Henrietta Hoover.D.  On: 11/07/2015 13:26   Dg Chest Portable 1 View  Result Date: 11/07/2015 CLINICAL DATA:  Fall off roof EXAM: PORTABLE CHEST 1 VIEW COMPARISON:  None. FINDINGS: Lungs are clear.  No pleural effusion or pneumothorax. The heart is normal in size. Visualized osseous structures are within normal limits. IMPRESSION: No evidence of acute cardiopulmonary disease. Electronically Signed   By:  Charline BillsSriyesh  Krishnan M.D.   On: 11/07/2015 13:25   Dg Hip Unilat With Pelvis 2-3 Views Right  Result Date: 11/07/2015 CLINICAL DATA:  Fall 20 feet, landing on right hip. EXAM: DG HIP (WITH OR WITHOUT PELVIS) 2-3V RIGHT COMPARISON:  None. FINDINGS: Comminuted fracture noted through the proximal right femoral shaft involving the lesser trochanter. Varus angulation and displacement of fracture fragments. Fracture through the right inferior pubic ramus, nondisplaced. No subluxation or dislocation. IMPRESSION: Comminuted, angulated and displaced fracture through the proximal right femoral shaft involving the lesser trochanter. Nondisplaced fracture through the right inferior pubic ramus. Electronically Signed   By: Charlett NoseKevin  Dover M.D.   On: 11/07/2015 14:07   Dg Femur Min 2 Views Right  Result Date: 11/07/2015 CLINICAL DATA:  Initial evaluation for acute trauma, fall. EXAM: RIGHT FEMUR 2 VIEWS COMPARISON:  None. FINDINGS: External traction device in place. There is an acute mildly comminuted oblique fracture through the intertrochanteric region of the left hip with sub trochanteric extension. Fracture line oriented longitudinally into the long axis of the femur. Slight medial and posterior displacement. Right femoral head normally position within the acetabulum. No discrete acetabular fracture identified. Linear lucency through the right inferior pubic ramus suspicious for possible fracture as well. Pubis symphysis approximated. Visualized SI joints approximated. The IMPRESSION: 1. Acute comminuted oblique intertrochanteric right femoral fracture. 2. Linear lucency through the right inferior pubic ramus, suspicious for acute nondisplaced fracture. Electronically Signed   By: Rise MuBenjamin  McClintock M.D.   On: 11/07/2015 14:15    Procedures Procedures (including critical care time)  Medications Ordered in ED Medications  iopamidol (ISOVUE-300) 61 % injection (not administered)  HYDROmorphone (DILAUDID) injection  1 mg (not administered)  sodium chloride 0.9 % bolus 1,000 mL (1,000 mLs Intravenous New Bag/Given 11/07/15 1523)     Initial Impression / Assessment and Plan / ED Course  I have reviewed the triage vital signs and the nursing notes.  Pertinent labs & imaging results that were available during my care of the patient were reviewed by me and considered in my medical decision making (see chart for details).  Clinical Course    20 yo M with no significant PMSHx presenting as a level 2 trauma after sustaining injury to his right leg after a fall from 20 ft, as above. Upon arrival, ABCs intact, secondary exam notable for proximal femoral deformity and shortening of RLE. CT abdomen/pelvis confirmed fracture and revealed no additional intra abdominal or intra pelvic pathology.   Workup as above, notable for right proximal femoral fracture. Neurovascularly intact, closed fracture. Ortho consulted, plan for operative repair.   Case discussed with Dr. Clayborne DanaMesner who oversaw management of this patient.   Final Clinical Impressions(s) / ED Diagnoses   Final diagnoses:  Fall  Closed fracture of right femur, unspecified fracture morphology, unspecified portion of femur, initial encounter [facility](HCC)    New Prescriptions New Prescriptions   No medications on file     Urban GibsonJenny Sophina Mitten, MD 11/07/15 1631    Marily MemosJason Mesner, MD 11/08/15 [phone]

## 2015-11-07 NOTE — ED Provider Notes (Signed)
  Physical Exam  BP 128/80 (BP Location: Right Arm)   Pulse 65   Temp 98.7 F (37.1 C) (Oral)   Resp 18   SpO2 99%   Physical Exam  ED Course  Procedures  MDM Patient seen by Dr. Dairl PonderBrook's PA. I called Dr. Shon BatonBrooks. He request trauma consult given mechanism and he will admit. Called Dr. Francena HanlyKisinger to consult      Charlynne Panderavid Hsienta Haik Mahoney, MD 11/07/15 [id number]

## 2015-11-08 ENCOUNTER — Inpatient Hospital Stay (HOSPITAL_COMMUNITY): Payer: Managed Care, Other (non HMO)

## 2015-11-08 LAB — CBC
HEMATOCRIT: 36.6 % — AB (ref 39.0–52.0)
Hemoglobin: 12.4 g/dL — ABNORMAL LOW (ref 13.0–17.0)
MCH: 30.8 pg (ref 26.0–34.0)
MCHC: 33.9 g/dL (ref 30.0–36.0)
MCV: 91 fL (ref 78.0–100.0)
Platelets: 197 10*3/uL (ref 150–400)
RBC: 4.02 MIL/uL — ABNORMAL LOW (ref 4.22–5.81)
RDW: 12.6 % (ref 11.5–15.5)
WBC: 17 10*3/uL — ABNORMAL HIGH (ref 4.0–10.5)

## 2015-11-08 LAB — BASIC METABOLIC PANEL
ANION GAP: 7 (ref 5–15)
BUN: 9 mg/dL (ref 6–20)
CALCIUM: 8.7 mg/dL — AB (ref 8.9–10.3)
CO2: 28 mmol/L (ref 22–32)
Chloride: 104 mmol/L (ref 101–111)
Creatinine, Ser: 0.77 mg/dL (ref 0.61–1.24)
GFR calc Af Amer: >60 mL/min (ref >60)
GLUCOSE: 108 mg/dL — AB (ref 65–99)
Potassium: 5 mmol/L (ref 3.5–5.1)
Sodium: 139 mmol/L (ref 135–145)

## 2015-11-08 LAB — CREATININE, SERUM
Creatinine, Ser: 0.84 mg/dL (ref 0.61–1.24)
GFR calc Af Amer: >60 mL/min (ref >60)
GFR calc non Af Amer: >60 mL/min (ref >60)

## 2015-11-08 MED ORDER — METOCLOPRAMIDE HCL 5 MG/ML IJ SOLN: 5.0000 mg | Freq: Three times a day (TID) | INTRAMUSCULAR | Status: DC | PRN

## 2015-11-08 MED ORDER — SODIUM CHLORIDE 0.9 % IV SOLN: INTRAVENOUS | Status: DC

## 2015-11-08 MED ORDER — ENOXAPARIN SODIUM 40 MG/0.4ML ~~LOC~~ SOLN
40.0000 mg | SUBCUTANEOUS | Status: DC
  Administered 2015-11-08 – 2015-11-13 (×6): 40 mg via SUBCUTANEOUS
  Filled 2015-11-08 (×6): qty 0.4

## 2015-11-08 MED ORDER — METHOCARBAMOL 500 MG PO TABS
500.0000 mg | ORAL_TABLET | Freq: Four times a day (QID) | ORAL | Status: DC | PRN
  Administered 2015-11-08 – 2015-11-13 (×15): 500 mg via ORAL
  Filled 2015-11-08 (×15): qty 1

## 2015-11-08 MED ORDER — ONDANSETRON HCL 4 MG PO TABS
4.0000 mg | ORAL_TABLET | Freq: Four times a day (QID) | ORAL | Status: DC | PRN
  Administered 2015-11-13: 4 mg via ORAL
  Filled 2015-11-08 (×2): qty 1

## 2015-11-08 MED ORDER — CEFAZOLIN SODIUM-DEXTROSE 2-4 GM/100ML-% IV SOLN
2.0000 g | Freq: Four times a day (QID) | INTRAVENOUS | Status: AC
  Administered 2015-11-08 (×2): 2 g via INTRAVENOUS
  Filled 2015-11-08 (×2): qty 100

## 2015-11-08 MED ORDER — FENTANYL CITRATE (PF) 100 MCG/2ML IJ SOLN
INTRAMUSCULAR | Status: AC
  Filled 2015-11-08: qty 2

## 2015-11-08 MED ORDER — ACETAMINOPHEN 650 MG RE SUPP: 650.0000 mg | Freq: Four times a day (QID) | RECTAL | Status: DC | PRN

## 2015-11-08 MED ORDER — MENTHOL 3 MG MT LOZG: 1.0000 | LOZENGE | OROMUCOSAL | Status: DC | PRN

## 2015-11-08 MED ORDER — OXYCODONE HCL 5 MG PO TABS
5.0000 mg | ORAL_TABLET | ORAL | Status: DC | PRN
Start: 2015-11-08 — End: 2015-11-13
  Administered 2015-11-08 – 2015-11-10 (×14): 10 mg via ORAL
  Administered 2015-11-11: 5 mg via ORAL
  Administered 2015-11-11 (×2): 10 mg via ORAL
  Administered 2015-11-12: 5 mg via ORAL
  Administered 2015-11-12 – 2015-11-13 (×4): 10 mg via ORAL
  Filled 2015-11-08: qty 2
  Filled 2015-11-08: qty 1
  Filled 2015-11-08 (×4): qty 2
  Filled 2015-11-08 (×2): qty 1
  Filled 2015-11-08: qty 2
  Filled 2015-11-08: qty 1
  Filled 2015-11-08 (×7): qty 2
  Filled 2015-11-08: qty 1
  Filled 2015-11-08 (×6): qty 2

## 2015-11-08 MED ORDER — METOCLOPRAMIDE HCL 5 MG PO TABS: 5.0000 mg | ORAL_TABLET | Freq: Three times a day (TID) | ORAL | Status: DC | PRN

## 2015-11-08 MED ORDER — LACTATED RINGERS IV SOLN
INTRAVENOUS | Status: DC
  Administered 2015-11-09 – 2015-11-12 (×4): via INTRAVENOUS

## 2015-11-08 MED ORDER — MORPHINE SULFATE (PF) 2 MG/ML IV SOLN
0.5000 mg | INTRAVENOUS | Status: DC | PRN
  Administered 2015-11-09: 0.5 mg via INTRAVENOUS
  Filled 2015-11-08 (×2): qty 1

## 2015-11-08 MED ORDER — PHENOL 1.4 % MT LIQD
1.0000 | OROMUCOSAL | Status: DC | PRN
Start: 2015-11-08 — End: 2015-11-13

## 2015-11-08 MED ORDER — ONDANSETRON HCL 4 MG/2ML IJ SOLN
INTRAMUSCULAR | Status: DC | PRN
  Administered 2015-11-08: 4 mg via INTRAVENOUS

## 2015-11-08 MED ORDER — SUGAMMADEX SODIUM 200 MG/2ML IV SOLN
INTRAVENOUS | Status: DC | PRN
  Administered 2015-11-08: 150 mg via INTRAVENOUS

## 2015-11-08 MED ORDER — DEXTROSE 5 % IV SOLN: 500.0000 mg | Freq: Four times a day (QID) | INTRAVENOUS | Status: DC | PRN

## 2015-11-08 MED ORDER — ONDANSETRON HCL 4 MG/2ML IJ SOLN
4.0000 mg | Freq: Four times a day (QID) | INTRAMUSCULAR | Status: DC | PRN
Start: 2015-11-08 — End: 2015-11-13
  Administered 2015-11-08 – 2015-11-13 (×6): 4 mg via INTRAVENOUS
  Filled 2015-11-08 (×6): qty 2

## 2015-11-08 MED ORDER — ACETAMINOPHEN 325 MG PO TABS
650.0000 mg | ORAL_TABLET | Freq: Four times a day (QID) | ORAL | Status: DC | PRN
  Administered 2015-11-09 – 2015-11-12 (×3): 650 mg via ORAL
  Filled 2015-11-08 (×4): qty 2

## 2015-11-08 MED ORDER — ALUM & MAG HYDROXIDE-SIMETH 200-200-20 MG/5ML PO SUSP: 30.0000 mL | ORAL | Status: DC | PRN

## 2015-11-08 MED ORDER — ACETAMINOPHEN 500 MG PO TABS
1000.0000 mg | ORAL_TABLET | Freq: Four times a day (QID) | ORAL | Status: AC
  Administered 2015-11-08 – 2015-11-09 (×3): 1000 mg via ORAL
  Filled 2015-11-08 (×4): qty 2

## 2015-11-08 MED ORDER — POLYETHYLENE GLYCOL 3350 17 G PO PACK
17.0000 g | PACK | Freq: Every day | ORAL | Status: DC | PRN
  Administered 2015-11-09: 17 g via ORAL
  Filled 2015-11-08: qty 1

## 2015-11-08 NOTE — Progress Notes (Signed)
1 Day Post-Op  Subjective: Complains only of right hip pain  Objective: Vital signs in last 24 hours: Temp:  [97.5 F (36.4 C)-98.7 F (37.1 C)] 97.5 F (36.4 C) (09/16 0500) Pulse Rate:  [54-93] 68 (09/16 0500) Resp:  [11-18] 14 (09/16 0324) BP: (104-137)/(64-89) 123/66 (09/16 0500) SpO2:  [92 %-100 %] 100 % (09/16 0500) Weight:  [77.1 kg (170 lb)] 77.1 kg (170 lb) (09/15 1255) Last BM Date: 11/06/15  Intake/Output from previous day: 09/15 0701 - 09/16 0700 In: 3100 [I.V.:3000; IV Piggyback:100] Out: 1500 [Urine:1200; Blood:300] Intake/Output this shift: No intake/output data recorded.  Resp: clear to auscultation bilaterally Cardio: regular rate and rhythm GI: soft, nontender  Lab Results:   Recent Labs  11/07/15 1314 11/08/15 0328  WBC 9.0 17.0*  HGB 15.9 12.4*  HCT 45.5 36.6*  PLT 194 197   BMET  Recent Labs  11/07/15 1314 11/08/15 0328  NA 138 139  K 3.8 5.0  CL 106 104  CO2 23 28  GLUCOSE 103* 108*  BUN 13 9  CREATININE 0.91 0.84  0.77  CALCIUM 9.5 8.7*   PT/INR  Recent Labs  11/07/15 1314  LABPROT 14.6  INR 1.14   ABG No results for input(s): PHART, HCO3 in the last 72 hours.  Invalid input(s): PCO2, PO2  Studies/Results: Dg Knee 1-2 Views Right  Result Date: 11/07/2015 CLINICAL DATA:  Initial evaluation for acute trauma, fall. EXAM: RIGHT KNEE - 1-2 VIEW COMPARISON:  None. FINDINGS: No evidence of fracture, dislocation, or joint effusion. No evidence of arthropathy or other focal bone abnormality. Soft tissues are unremarkable. External traction device in place. IMPRESSION: Negative. Electronically Signed   By: Rise MuBenjamin  McClintock M.D.   On: 11/07/2015 14:16   Dg Tibia/fibula Right  Result Date: 11/07/2015 CLINICAL DATA:  Initial evaluation for acute trauma, fall. EXAM: RIGHT TIBIA AND FIBULA - 2 VIEW COMPARISON:  None. FINDINGS: There is no evidence of fracture or other focal bone lesions. Soft tissues are unremarkable. External  traction device in place. IMPRESSION: Negative. Electronically Signed   By: Rise MuBenjamin  McClintock M.D.   On: 11/07/2015 14:31   Ct Abdomen Pelvis W Contrast  Result Date: 11/07/2015 CLINICAL DATA:  Fall 20 foot from roof.  Right hip pain. EXAM: CT ABDOMEN AND PELVIS WITH CONTRAST TECHNIQUE: Multidetector CT imaging of the abdomen and pelvis was performed using the standard protocol following bolus administration of intravenous contrast. CONTRAST:  100 cc Isovue 300 IV COMPARISON:  None. FINDINGS: Lower chest: Lung bases are clear. No effusions. Heart is normal size. Hepatobiliary: No focal hepatic abnormality. Gallbladder unremarkable. Pancreas: No focal abnormality or ductal dilatation. Spleen: No focal abnormality.  Normal size. Adrenals/Urinary Tract: No adrenal abnormality. No focal renal abnormality. No stones or hydronephrosis. Urinary bladder is unremarkable. Stomach/Bowel: Stomach, large and small bowel grossly unremarkable. Vascular/Lymphatic: No evidence of aneurysm or adenopathy. Reproductive: No visible focal abnormality. Other: No free fluid or free air. Musculoskeletal: Comminuted fracture noted through the proximal right femur as seen on plain film. No additional acute bony abnormality. IMPRESSION: Comminuted proximal right femoral fracture. No acute intra-abdominal abnormality. Electronically Signed   By: Charlett NoseKevin  Dover M.D.   On: 11/07/2015 14:24   Pelvis Portable  Result Date: 11/08/2015 CLINICAL DATA:  Postoperative radiograph, status post internal fixation of right femoral fracture. Initial encounter. EXAM: PORTABLE PELVIS 1-2 VIEWS COMPARISON:  Intraoperative images performed 11/07/2015 FINDINGS: An intramedullary rod and screw are partially imaged at the proximal right femur, transfixing the subtrochanteric and intertrochanteric fracture of  the proximal right femur in near anatomic alignment. No new fractures are seen. A nondisplaced fracture through the right inferior pubic ramus is again  noted. Overlying postoperative soft tissue air is noted. The left hip is unremarkable in appearance. Contrast is noted filling the bladder. The visualized bowel gas pattern is grossly unremarkable. IMPRESSION: Status post internal fixation of subtrochanteric and intertrochanteric fracture of the proximal right femur in near anatomic alignment. No new fracture seen. Nondisplaced fracture through the right inferior pubic ramus again noted. Electronically Signed   By: Roanna Raider M.D.   On: 11/08/2015 03:41   Dg Pelvis Portable  Result Date: 11/07/2015 CLINICAL DATA:  20 year old male with pelvic pain following fall. Initial encounter. EXAM: PORTABLE PELVIS 1-2 VIEWS COMPARISON:  None. FINDINGS: An oblique fracture of the proximal right femur with apex lateral angulation noted. This fracture extends inferiorly and laterally from the lesser trochanter. A probable nondisplaced fracture of the right inferior pubic ramus is noted. No subluxation, dislocation or diastases noted. IMPRESSION: Proximal right femur fracture as described. Question nondisplaced right inferior pubic ramus fracture. Electronically Signed   By: Harmon Pier M.D.   On: 11/07/2015 13:26   Ct Femur Right Wo Contrast  Result Date: 11/07/2015 CLINICAL DATA:  Fall from roof today.  Right femur fracture. EXAM: CT OF THE RIGHT FEMUR WITHOUT CONTRAST TECHNIQUE: Multidetector CT imaging was performed according to the standard protocol. Multiplanar CT image reconstructions were also generated. COMPARISON:  Pelvic and right hip radiograph same date. FINDINGS: Bones/Joint/Cartilage There is a comminuted subtrochanteric femur fracture with intertrochanteric extension medially. There is medial displacement of the lesser trochanter. This fracture is mildly displaced posteriorly and laterally. There is apex lateral angulation and a 9.8 cm butterfly fragment posteriorly. The greater trochanter and femoral neck are intact. The femoral head is intact and  located. There is a nondisplaced fracture of the right inferior pubic ramus. The superior pubic ramus appears intact. The distal right femur appears intact. Ligaments Not relevant for exam/indication. Muscles and Tendons Contusion within the quadriceps musculature proximally. No large hematoma or other focal fluid collection identified. No evidence of tendon rupture by CT. Soft Tissues None. IMPRESSION: 1. Comminuted subtrochanteric right femur fracture with medial intertrochanteric extension. This fracture is mildly displaced and angulated. 2. Nondisplaced fracture of the right inferior pubic ramus. Electronically Signed   By: Carey Bullocks M.D.   On: 11/07/2015 17:18   Dg Chest Portable 1 View  Result Date: 11/07/2015 CLINICAL DATA:  Fall off roof EXAM: PORTABLE CHEST 1 VIEW COMPARISON:  None. FINDINGS: Lungs are clear.  No pleural effusion or pneumothorax. The heart is normal in size. Visualized osseous structures are within normal limits. IMPRESSION: No evidence of acute cardiopulmonary disease. Electronically Signed   By: Charline Bills M.D.   On: 11/07/2015 13:25   Dg C-arm 61-120 Min  Result Date: 11/08/2015 CLINICAL DATA:  Internal fixation of right femoral fracture. Initial encounter. EXAM: RIGHT FEMUR 2 VIEWS; DG C-ARM 61-120 MIN COMPARISON:  Right femur radiographs and CT performed 11/07/2015 FINDINGS: Nine fluoroscopic C-arm images are provided from the OR. These demonstrate placement of an intramedullary rod and screws through the patient's right femoral subtrochanteric and intertrochanteric fracture, seen in near anatomic alignment. There is mild residual displacement of the greater tuberosity fragment. No new fractures are seen. Overlying postoperative changes are noted. IMPRESSION: Status post internal fixation of right femoral subtrochanteric and intertrochanteric fracture in near anatomic alignment. Electronically Signed   By: Roanna Raider M.D.   On:  11/08/2015 03:04   Dg Hip  Unilat With Pelvis 2-3 Views Right  Result Date: 11/07/2015 CLINICAL DATA:  Fall 20 feet, landing on right hip. EXAM: DG HIP (WITH OR WITHOUT PELVIS) 2-3V RIGHT COMPARISON:  None. FINDINGS: Comminuted fracture noted through the proximal right femoral shaft involving the lesser trochanter. Varus angulation and displacement of fracture fragments. Fracture through the right inferior pubic ramus, nondisplaced. No subluxation or dislocation. IMPRESSION: Comminuted, angulated and displaced fracture through the proximal right femoral shaft involving the lesser trochanter. Nondisplaced fracture through the right inferior pubic ramus. Electronically Signed   By: Charlett Nose M.D.   On: 11/07/2015 14:07   Dg Femur, Min 2 Views Right  Result Date: 11/08/2015 CLINICAL DATA:  Internal fixation of right femoral fracture. Initial encounter. EXAM: RIGHT FEMUR 2 VIEWS; DG C-ARM 61-120 MIN COMPARISON:  Right femur radiographs and CT performed 11/07/2015 FINDINGS: Nine fluoroscopic C-arm images are provided from the OR. These demonstrate placement of an intramedullary rod and screws through the patient's right femoral subtrochanteric and intertrochanteric fracture, seen in near anatomic alignment. There is mild residual displacement of the greater tuberosity fragment. No new fractures are seen. Overlying postoperative changes are noted. IMPRESSION: Status post internal fixation of right femoral subtrochanteric and intertrochanteric fracture in near anatomic alignment. Electronically Signed   By: Roanna Raider M.D.   On: 11/08/2015 03:04   Dg Femur Min 2 Views Right  Result Date: 11/07/2015 CLINICAL DATA:  Initial evaluation for acute trauma, fall. EXAM: RIGHT FEMUR 2 VIEWS COMPARISON:  None. FINDINGS: External traction device in place. There is an acute mildly comminuted oblique fracture through the intertrochanteric region of the left hip with sub trochanteric extension. Fracture line oriented longitudinally into the long  axis of the femur. Slight medial and posterior displacement. Right femoral head normally position within the acetabulum. No discrete acetabular fracture identified. Linear lucency through the right inferior pubic ramus suspicious for possible fracture as well. Pubis symphysis approximated. Visualized SI joints approximated. The IMPRESSION: 1. Acute comminuted oblique intertrochanteric right femoral fracture. 2. Linear lucency through the right inferior pubic ramus, suspicious for acute nondisplaced fracture. Electronically Signed   By: Rise Mu M.D.   On: 11/07/2015 14:15    Anti-infectives: Anti-infectives    Start     Dose/Rate Route Frequency Ordered Stop   11/08/15 0600  ceFAZolin (ANCEF) IVPB 2g/100 mL premix     2 g 200 mL/hr over 30 Minutes Intravenous Every 6 hours 11/08/15 0101 11/08/15 1759   11/07/15 2130  ceFAZolin (ANCEF) IVPB 2g/100 mL premix  Status:  Discontinued     2 g 200 mL/hr over 30 Minutes Intravenous  Once 11/07/15 2056 11/08/15 0102      Assessment/Plan: s/p Procedure(s): INTRAMEDULLARY (IM) NAIL INTERTROCHANTRIC (Right) No obvious chest or abdominal injury  Alert and awake with no obvious head injury Disposition per Ortho. Will sign off  LOS: 1 day    TOTH III,PAUL S 11/08/2015

## 2015-11-08 NOTE — Transfer of Care (Signed)
Immediate Anesthesia Transfer of Care Note  Patient: Bryan Monroe  Procedure(s) Performed: Procedure(s): INTRAMEDULLARY (IM) NAIL INTERTROCHANTRIC (Right)  Patient Location: PACU  Anesthesia Type:General  Level of Consciousness: awake and sedated  Airway & Oxygen Therapy: Patient Spontanous Breathing and Patient connected to nasal cannula oxygen  Post-op Assessment: Report given to RN and Post -op Vital signs reviewed and stable  Post vital signs: Reviewed and stable  Last Vitals:  Vitals:   11/07/15 2015 11/07/15 2101  BP: 111/72 115/68  Pulse: 68 72  Resp:    Temp:  36.5 C    Last Pain:  Vitals:   11/07/15 2101  TempSrc: Oral  PainSc:          Complications: No apparent anesthesia complications

## 2015-11-08 NOTE — Op Note (Signed)
Bryan Monroe, LINSEY                ACCOUNT NO.:  [id number]  MEDICAL RECORD NO.:  [id number]  LOCATION:  [id number]                        FACILITY:  [facility]  PHYSICIAN:  Brailyn Killion D. Shon Baton, M.D. DATE OF BIRTH:  Nov 26, 1995  DATE OF PROCEDURE:  11/08/2015 DATE OF DISCHARGE:                              OPERATIVE REPORT   FIRST ASSISTANT:  Anette Riedel, PA.  PREOPERATIVE DIAGNOSIS:  Right closed subtrochanteric femur fracture.  POSTOPERATIVE DIAGNOSIS:  Right closed subtrochanteric femur fracture.  OPERATIVE PROCEDURE:  Intramedullary nail fixation of the right subtrochanteric femur fracture.  COMPLICATIONS:  None.  CONDITION:  Stable.  HISTORY:  This is a very pleasant 20 year old gentleman who fell 2-1/2 stories while trying to get back into a locked apartment and noted immediate right hip pain.  He was unable to walk and was brought into the emergency department.  X-rays demonstrated a closed subtrochanteric femur fracture and I was consulted for further treatment and evaluation. After evaluating the patient, I discussed the risks and benefits of surgery with the patient and his family.  Consent was obtained, and we planned to move forward with the surgical procedure.  All appropriate risks and benefits were discussed.  OPERATIVE NOTE:  The patient was brought to the operating room and placed supine on the operating table.  After successful induction of general anesthesia and endotracheal intubation, TEDs and SCDs were placed on the left lower extremity and the right was placed in the traction device.  All bony prominences were well padded and the right lower extremity which was marked in the holding area was prepped and draped in a standard fashion.  Time-out was taken confirming patient, procedure, and all other pertinent important data.  At this point in time, an x-ray was used to identify the fracture site.  An incision was made starting at the superior end of the greater  trochanter and proceeding superiorly.  I placed a Steinmann pin into the proximal fragment in order to gain control of the fragment and hold it reduced during the reaming.  Once I had the fracture reduced, I then placed my starting guidepin at the tip of the greater trochanter and advanced the pin down the greater trochanter into the proximal femur and into the distal fragment.  I confirmed satisfactory trajectory in both the AP and lateral planes. With the Steinmann pin holding the proximal fragment, I was able to hold it reduced.  I was able to use my starting trocar to ream out the greater trochanter, and then I placed my reaming pin all the way down the femur.  It was properly situated in the AP and lateral planes, and the fracture was maintained in its reduced position.  I then consensually reamed starting at the 8.5 and proceeding up to the 11.  I had good chatter at the 11 and again the fracture remained reduced during the reaming.  At this point, I obtained a size 10 [employer]ynthe[employer] cephalomedullary trochanteric nail and assembled it, and then gently advanced it over the reaming guidewire.  Again, I maintained the fracture reduction during the insertion of the nail.  The nail easily went past the fracture site and down into the distal femur.  I gently malleted it until I got to the appropriate depth.  I confirmed depth and trajectory in both planes.  At this point, I noted that once the nail was at its appropriate depth that there was some displacement of the fracture.  In fact, the proximal fragment had displaced laterally.  In the lateral plane, I was still well reduced and there was no displacement superior and anterior, it was just a medial displacement of the distal fragment.  At this point, I tried to reduce it but it was locked in position.  It was not horrific, so, I elected to keep it as I could not reduce it and improve the reduction.  It was stable and secure.  I then made a  second stab incision for the lag screw.  I placed the guidepin and then overdrilled the guidepin and then placed a 105 mm lag screw which got excellent purchase.  At this point, I removed the Steinmann pin and then obtained perfect circles distally and placed 2 static locking screws into the distal nail.  At this point, the nail was statically locked.  The fracture was reduced satisfactorily.  Final AP and lateral films were satisfactory.  All wounds were copiously irrigated with normal saline.  The proximal wound was closed in a layered fashion with #1 Vicryl sutures, 2-0 Vicryl sutures, and staples for the skin.  The remaining incisions were closed in a layered fashion with 2-0 Vicryl and staples.  Steri-Strips and dry dressings were applied to all wounds and the patient was extubated, transferred to PACU without incident.  At the end of the case all needle and sponge counts were correct.  There were no adverse intraoperative events.     Halina Asano D. Shon BatonBrooks, M.D.     DDB/MEDQ  D:  11/08/2015  T:  11/08/2015  Job:  [id number]

## 2015-11-08 NOTE — Anesthesia Postprocedure Evaluation (Signed)
Anesthesia Post Note  Patient: Bryan Monroe  Procedure(s) Performed: Procedure(s) (LRB): INTRAMEDULLARY (IM) NAIL INTERTROCHANTRIC (Right)  Patient location during evaluation: PACU Anesthesia Type: General Level of consciousness: awake and alert Pain management: pain level controlled Vital Signs Assessment: post-procedure vital signs reviewed and stable Respiratory status: spontaneous breathing, nonlabored ventilation, respiratory function stable and patient connected to nasal cannula oxygen Cardiovascular status: blood pressure returned to baseline and stable Postop Assessment: no signs of nausea or vomiting Anesthetic complications: no    Last Vitals:  Vitals:   11/08/15 0324 11/08/15 0500  BP: 120/69 123/66  Pulse: (!) 57 68  Resp: 14   Temp: 36.6 C 36.4 C    Last Pain:  Vitals:   11/08/15 0642  TempSrc:   PainSc: Asleep                 Cecile HearingStephen Edward Turk

## 2015-11-08 NOTE — Brief Op Note (Signed)
11/07/2015  12:54 AM  PATIENT:  Bryan Monroe  20 y.o. male  PRE-OPERATIVE DIAGNOSIS:  right femur fracture  POST-OPERATIVE DIAGNOSIS:  right femur fracture  PROCEDURE:  Procedure(s): INTRAMEDULLARY (IM) NAIL INTERTROCHANTRIC (Right)  SURGEON:  Surgeon(s) and Role:    * Venita Lickahari Calandria Mullings, MD - Primary  PHYSICIAN ASSISTANT:   ASSISTANTS: Carmon Mayo   ANESTHESIA:   general  EBL:  Total I/O In: 1000 [I.V.:1000] Out: 300 [Blood:300]  BLOOD ADMINISTERED:none  DRAINS: none   LOCAL MEDICATIONS USED:  NONE  SPECIMEN:  No Specimen  DISPOSITION OF SPECIMEN:  N/A  COUNTS:  YES  TOURNIQUET:  * No tourniquets in log *  DICTATION: .Other Dictation: Dictation Number [id number]  PLAN OF CARE: Admit to inpatient   PATIENT DISPOSITION:  PACU - hemodynamically stable.

## 2015-11-08 NOTE — Progress Notes (Signed)
Orthopedic Tech Progress Note Patient Details:  Bryan Monroe Sep 21, 1995 [id number]  Patient ID: Bryan Monroe, male   DOB: Sep 21, 1995, 20 y.o.   MRN: [id number] [facility]   Trinna PostMartinez, Makira Holleman J 11/08/2015, 2:29 AM

## 2015-11-08 NOTE — Progress Notes (Signed)
   11/08/15 0324  Vitals  Temp 97.9 F (36.6 C)  BP 120/69  BP Location Right Arm  BP Method Automatic  Patient Position (if appropriate) Lying  Pulse Rate (!) 57  Pulse Rate Source Radial;Right  Resp 14  Patient returned from surgery at 0305. Patient drowsy, no c/o pain, mother at the bedside.

## 2015-11-08 NOTE — Progress Notes (Signed)
Subjective: 1 Day Post-Op Procedure(s) (LRB): INTRAMEDULLARY (IM) NAIL INTERTROCHANTRIC (Right)  Patient reports pain as mild to moderate.  Minimal appetite thus far, but admits to sipping water and coffee.  Denies BM, however admits to flatulence.  Denies fever, chills, N/V.  Objective:   VITALS:  Temp:  [97.5 F (36.4 C)-98.7 F (37.1 C)] 97.5 F (36.4 C) (09/16 0500) Pulse Rate:  [54-93] 68 (09/16 0500) Resp:  [11-18] 14 (09/16 0324) BP: (104-137)/(64-89) 123/66 (09/16 0500) SpO2:  [92 %-100 %] 100 % (09/16 0500) Weight:  [77.1 kg (170 lb)] 77.1 kg (170 lb) (09/15 1255)  General: WDWN patient in NAD. Psych:  Appropriate mood and affect. Neuro:  A&O x 3, Moving all extremities, sensation intact to light touch HEENT:  EOMs intact Chest:  Even non-labored respirations Skin:  Dressing changed, incision C/D/I, no rashes or lesions Extremities: warm/dry, mild edema, no erythema or echymosis.  No lymphadenopathy. Pulses: Popliteus 2+ MSK:  ROM: EHL/FHL intact, MMT: patient is able to perform quad set, (-) Homan's    LABS  Recent Labs  11/07/15 1314 11/08/15 0328  HGB 15.9 12.4*  WBC 9.0 17.0*  PLT 194 197    Recent Labs  11/07/15 1314 11/08/15 0328  NA 138 139  K 3.8 5.0  CL 106 104  CO2 23 28  BUN 13 9  CREATININE 0.91 0.84  0.77  GLUCOSE 103* 108*    Recent Labs  11/07/15 1314  INR 1.14     Assessment/Plan: 1 Day Post-Op Procedure(s) (LRB): INTRAMEDULLARY (IM) NAIL INTERTROCHANTRIC (Right)  WBAT R LE Up with therapy.  Patient needs to sit up in chair today Dressing changed today. Plan for outpatient post-op visit with Dr. Beryle LatheBrooks  Sarahanne Novakowski, PA-C, [facility]:  [phone]

## 2015-11-09 ENCOUNTER — Encounter (HOSPITAL_COMMUNITY): Payer: Self-pay | Admitting: *Deleted

## 2015-11-09 LAB — CBC
HEMATOCRIT: 27.2 % — AB (ref 39.0–52.0)
Hemoglobin: 9.4 g/dL — ABNORMAL LOW (ref 13.0–17.0)
MCH: 30.7 pg (ref 26.0–34.0)
MCHC: 34.6 g/dL (ref 30.0–36.0)
MCV: 88.9 fL (ref 78.0–100.0)
PLATELETS: 137 10*3/uL — AB (ref 150–400)
RBC: 3.06 MIL/uL — ABNORMAL LOW (ref 4.22–5.81)
RDW: 12.5 % (ref 11.5–15.5)
WBC: 9.7 10*3/uL (ref 4.0–10.5)

## 2015-11-09 LAB — BASIC METABOLIC PANEL
Anion gap: 5 (ref 5–15)
BUN: <5 mg/dL — ABNORMAL LOW (ref 6–20)
CALCIUM: 7.8 mg/dL — AB (ref 8.9–10.3)
CO2: 27 mmol/L (ref 22–32)
CREATININE: 0.81 mg/dL (ref 0.61–1.24)
Chloride: 104 mmol/L (ref 101–111)
GFR calc Af Amer: >60 mL/min (ref >60)
GLUCOSE: 94 mg/dL (ref 65–99)
Potassium: 3.7 mmol/L (ref 3.5–5.1)
Sodium: 136 mmol/L (ref 135–145)

## 2015-11-09 MED ORDER — INFLUENZA VAC SPLIT QUAD 0.5 ML IM SUSY: 0.5000 mL | PREFILLED_SYRINGE | INTRAMUSCULAR | Status: DC

## 2015-11-09 NOTE — Progress Notes (Signed)
Subjective: 2 Days Post-Op Procedure(s) (LRB): INTRAMEDULLARY (IM) NAIL INTERTROCHANTRIC (Right) Patient reports pain as controlled to right leg.  Poor diet, but he does admit to being a picky eater. Limited with PT. Denies SOB, CP, or calf pain. Mother is at bedside.  Objective: Vital signs in last 24 hours: Temp:  [98.6 F (37 C)-99 F (37.2 C)] 99 F (37.2 C) (09/17 0528) Pulse Rate:  [84-105] 105 (09/17 0528) Resp:  [16] 16 (09/17 0320) BP: (120-128)/(58-75) 125/65 (09/17 0528) SpO2:  [95 %-100 %] 97 % (09/17 0528)  Intake/Output from previous day: 09/16 0701 - 09/17 0700 In: 1080 [P.O.:400; I.V.:680] Out: 900 [Urine:900] Intake/Output this shift: No intake/output data recorded.   Recent Labs  11/07/15 1314 11/08/15 0328 11/09/15 0355  HGB 15.9 12.4* 9.4*    Recent Labs  11/08/15 0328 11/09/15 0355  WBC 17.0* 9.7  RBC 4.02* 3.06*  HCT 36.6* 27.2*  PLT 197 137*    Recent Labs  11/08/15 0328 11/09/15 0355  NA 139 136  K 5.0 3.7  CL 104 104  CO2 28 27  BUN 9 <5*  CREATININE 0.84  0.77 0.81  GLUCOSE 108* 94  CALCIUM 8.7* 7.8*    Recent Labs  11/07/15 1314  INR 1.14    Well nourished. Alert and oriented x3. RRR, Lungs clear, BS x4. Abdomen soft and non tender. Right Calf soft and non tender. Right leg dressing C/D/I. No DVT signs. Compartment soft. No signs of infection.  Right LE grossly neurovascular intact.  Assessment/Plan: 2 Days Post-Op Procedure(s) (LRB): INTRAMEDULLARY (IM) NAIL INTERTROCHANTRIC (Right) Up with PT Goal to get in the chair today Diet as tolerated D/c planning and f/u with Dr.Brooks Monitor HGb for post-op anemia  Pinkey Mcjunkin L 11/09/2015, 8:49 AM

## 2015-11-09 NOTE — Evaluation (Signed)
Occupational Therapy Evaluation Patient Details Name: Bryan Monroe MRN: [id number] DOB: March 02, 1995 Today's Date: 11/09/2015    History of Present Illness 20 y.o. male who sustained R proximal comminuted femur fx when he fell approx 20 feet from a roof. He underwent IM nailing 11-08-15.   Clinical Impression   PTA, pt was independent with all ADLs and mobility. Pt currently requires mod +2 assist for basic transfers and LB ADLs due to acute RLE pain and resulting balance deficits. Pt demonstrated difficulty maintaining NWBing RLE and required physical assistance from his mother to keep R foot off the ground during transfers. Pt plans to d/c home with 24/7 assistance from his mother. Pt will benefit from continued acute OT to maximize independence and safety with ADLs and mobility to allow for safe discharge home. Recommend HHOT and DME listed below for home use.    Follow Up Recommendations  Home health OT;Supervision/Assistance - 24 hour    Equipment Recommendations  3 in 1 bedside comode;Tub bench;Wheelchair (measurements OT);Wheelchair cushion (measurements OT)    Recommendations for Other Services       Precautions / Restrictions Precautions Precautions: Fall Restrictions Weight Bearing Restrictions: Yes RLE Weight Bearing: Non weight bearing      Mobility Bed Mobility Overal bed mobility: Needs Assistance Bed Mobility: Sit to Supine       Sit to supine: Mod assist   General bed mobility comments: HOB flat, use of trapeze bar. Assist to bring RLE onto bed. Significantly increased time and effort.  Transfers Overall transfer level: Needs assistance Equipment used: Rolling walker (2 wheeled) Transfers: Sit to/from [employer]Stand;Stand Pivot Transfers Sit to Stand: Mod assist;+2 physical assistance Stand pivot transfers: Min assist;+2 physical assistance       General transfer comment: Pt's mother assisted. Assist for boost to stand from low-sitting recliner and to stabilize  balance once standing. Pt's mother held RLE while therapist assisted with pivotal steps to bed, but pt still attempting to place weight on RLE. Demonstrated and verbally explained technique using RW to push up and hop on LLE.    Balance Overall balance assessment: Needs assistance Sitting-balance support: No upper extremity supported;Feet supported Sitting balance-Leahy Scale: Fair Sitting balance - Comments: limited due to pain Postural control: Left lateral lean Standing balance support: Bilateral upper extremity supported;During functional activity Standing balance-Leahy Scale: Poor Standing balance comment: reliant on BUE support to maintain balance                            ADL Overall ADL's : Needs assistance/impaired Eating/Feeding: Set up;Sitting   Grooming: Wash/dry hands;Wash/dry face;Set up;Sitting   Upper Body Bathing: Set up;Sitting   Lower Body Bathing: Moderate assistance;+2 for physical assistance;Sit to/from stand   Upper Body Dressing : Set up;Sitting   Lower Body Dressing: Moderate assistance;+2 for physical assistance;Sit to/from stand   Toilet Transfer: Minimal assistance;+2 for physical assistance;Stand-pivot;BSC;RW   Toileting- Clothing Manipulation and Hygiene: Moderate assistance;+2 for physical assistance;Sit to/from stand       Functional mobility during ADLs: Minimal assistance;+2 for physical assistance;Rolling walker General ADL Comments: Difficulty maintaining NWBing RLE     Vision Vision Assessment?: No apparent visual deficits   Perception     Praxis      Pertinent Vitals/Pain Pain Assessment: 0-10 Pain Score: 6  Pain Location: RLE Pain Descriptors / Indicators: Aching;Sore;Spasm Pain Intervention(s): Limited activity within patient's tolerance;Monitored during session;Repositioned;Patient requesting pain meds-RN notified;Ice applied     Hand Dominance Right  Extremity/Trunk Assessment Upper Extremity  Assessment Upper Extremity Assessment: Overall WFL for tasks assessed   Lower Extremity Assessment Lower Extremity Assessment: RLE deficits/detail RLE Deficits / Details: decreased ROM and strength as expected post op RLE: Unable to fully assess due to pain RLE Coordination: decreased gross motor   Cervical / Trunk Assessment Cervical / Trunk Assessment: Normal   Communication Communication Communication: No difficulties   Cognition Arousal/Alertness: Awake/alert Behavior During Therapy: WFL for tasks assessed/performed Overall Cognitive Status: Within Functional Limits for tasks assessed                     General Comments       Exercises       Shoulder Instructions      Home Living Family/patient expects to be discharged to:: Private residence Living Arrangements: Parent Available Help at Discharge: Family;Available 24 hours/day Type of Home: House Home Access: Stairs to enter [employer] of Steps: 4 Entrance Stairs-Rails: Right Home Layout: Two level;Bed/bath upstairs (able to stay on main level temporarily) Alternate Level Stairs-Number of Steps: 13   Bathroom Shower/Tub: Tub/shower unit Shower/tub characteristics: Engineer, building services: Standard     Home Equipment: None          Prior Functioning/Environment Level of Independence: Independent        Comments: Pt is a Location manager at [employer]        OT Problem List: Decreased strength;Decreased range of motion;Decreased activity tolerance;Impaired balance (sitting and/or standing);Decreased safety awareness;Decreased knowledge of use of DME or AE;Decreased knowledge of precautions;Pain   OT Treatment/Interventions: Self-care/ADL training;Therapeutic exercise;Energy conservation;DME and/or AE instruction;Therapeutic activities;Patient/family education;Balance training    OT Goals(Current goals can be found in the care plan section) Acute Rehab OT Goals Patient Stated Goal: return  to college next semester OT Goal Formulation: With patient Time For Goal Achievement: 11/23/15 Potential to Achieve Goals: Good ADL Goals Pt Will Perform Upper Body Bathing: with modified independence;sitting Pt Will Perform Lower Body Bathing: with min assist;sit to/from stand Pt Will Transfer to Toilet: with min assist;stand pivot transfer;bedside commode Pt Will Perform Toileting - Clothing Manipulation and hygiene: with min assist;sit to/from stand Pt/caregiver will Perform Home Exercise Program: Increased strength;Both right and left upper extremity;With theraband;With written HEP provided;Independently  OT Frequency: Min 3X/week   Barriers to D/C:            Co-evaluation              End of Session Equipment Utilized During Treatment: Gait belt;Rolling walker Nurse Communication: Mobility status;Patient requests pain meds  Activity Tolerance: Patient limited by pain Patient left: in bed;with call bell/phone within reach;with family/visitor present;with SCD's reapplied   Time: 1610-9604 OT Time Calculation (min): 40 min Charges:  OT General Charges $OT Visit: 1 Procedure OT Evaluation $OT Eval Moderate Complexity: 1 Procedure OT Treatments $Self Care/Home Management : 23-37 mins G-Codes:    Nils Pyle, OTR/L Pager: [phone] 11/09/2015, 5:23 PM

## 2015-11-09 NOTE — Evaluation (Signed)
Physical Therapy Evaluation Patient Details Name: Yurem Viner MRN: [id number] DOB: 1995-10-25 Today's Date: 11/09/2015   History of Present Illness  20 y.o. male who sustained R proximal comminuted femur fx when he fell approx 20 feet from a roof. He underwent IM nailing 11-08-15.  Clinical Impression  Pt admitted with above diagnosis. Pt currently with functional limitations due to the deficits listed below (see PT Problem List). On eval, mobility was significantly limited by pain/muscle spasms. Pt required mod assist for bed mobility and transfers using RW. Pt unable to maintain NWB RLE in order to progress with gait.  Pt will benefit from skilled PT to increase their independence and safety with mobility to allow discharge to the venue listed below.       Follow Up Recommendations Home health PT;Supervision - Intermittent    Equipment Recommendations  Rolling walker with 5" wheels;Wheelchair (measurements PT);Wheelchair cushion (measurements PT)    Recommendations for Other Services       Precautions / Restrictions Precautions Precautions: Fall Restrictions RLE Weight Bearing: Non weight bearing      Mobility  Bed Mobility Overal bed mobility: Needs Assistance Bed Mobility: Supine to Sit     Supine to sit: Mod assist;HOB elevated     General bed mobility comments: +rail, increased time to complete, multimodal cues for sequencing throughout  Transfers Overall transfer level: Needs assistance Equipment used: Rolling walker (2 wheeled) Transfers: Sit to/from [employer] Sit to Stand: Mod assist Stand pivot transfers: Mod assist       General transfer comment: verbal cues for RLE NWB and sequencing  Ambulation/Gait             General Gait Details: Pt unable to maintain NWB RLE to progress with ambulation.  Stairs            Wheelchair Mobility    Modified Rankin (Stroke Patients Only)       Balance Overall balance assessment:  Needs assistance Sitting-balance support: Bilateral upper extremity supported;Feet supported Sitting balance-Leahy Scale: Good     Standing balance support: Bilateral upper extremity supported;During functional activity Standing balance-Leahy Scale: Fair                               Pertinent Vitals/Pain Pain Assessment: 0-10 Pain Score: 5  Pain Location: sx site Pain Descriptors / Indicators: Spasm;Sharp;Aching Pain Intervention(s): Limited activity within patient's tolerance;Monitored during session;Repositioned;RN gave pain meds during session    Home Living Family/patient expects to be discharged to:: Private residence Living Arrangements: Parent Available Help at Discharge: Family;Available 24 hours/day Type of Home: House Home Access: Stairs to enter Entrance Stairs-Rails: Right Entrance Stairs-Number of Steps: 4 Home Layout: Two level;Bed/bath upstairs (temporarily able to stay on main level) Home Equipment: None      Prior Function Level of Independence: Independent         Comments: Pt is a Consulting civil engineer at [employer].     Hand Dominance        Extremity/Trunk Assessment   Upper Extremity Assessment: Overall WFL for tasks assessed           Lower Extremity Assessment: RLE deficits/detail      Cervical / Trunk Assessment: Normal  Communication   Communication: No difficulties  Cognition Arousal/Alertness: Awake/alert Behavior During Therapy: WFL for tasks assessed/performed Overall Cognitive Status: Within Functional Limits for tasks assessed  General Comments      Exercises     Assessment/Plan    PT Assessment Patient needs continued PT services  PT Problem List Decreased strength;Decreased range of motion;Decreased activity tolerance;Decreased balance;Decreased knowledge of use of DME;Pain;Decreased mobility          PT Treatment Interventions DME instruction;Gait training;Stair training;Functional  mobility training;Balance training;Therapeutic exercise;Therapeutic activities;Patient/family education;Wheelchair mobility training    PT Goals (Current goals can be found in the Care Plan section)  Acute Rehab PT Goals Patient Stated Goal: return to college next semester PT Goal Formulation: With patient Time For Goal Achievement: 11/23/15 Potential to Achieve Goals: Good    Frequency Min 6X/week   Barriers to discharge        Co-evaluation               End of Session Equipment Utilized During Treatment: Gait belt Activity Tolerance: Patient limited by pain Patient left: in chair;with call bell/phone within reach;with family/visitor present Nurse Communication: Mobility status;Precautions;Weight bearing status         Time: 4098-11910917-0956 PT Time Calculation (min) (ACUTE ONLY): 39 min   Charges:   PT Evaluation $PT Eval Moderate Complexity: 1 Procedure PT Treatments $Therapeutic Activity: 23-37 mins   PT G Codes:        Ilda FoilGarrow, Jelene Albano Rene 11/09/2015, 10:15 AM

## 2015-11-10 LAB — CBC
HEMATOCRIT: 25.4 % — AB (ref 39.0–52.0)
Hemoglobin: 8.8 g/dL — ABNORMAL LOW (ref 13.0–17.0)
MCH: 30.8 pg (ref 26.0–34.0)
MCHC: 34.6 g/dL (ref 30.0–36.0)
MCV: 88.8 fL (ref 78.0–100.0)
Platelets: 146 10*3/uL — ABNORMAL LOW (ref 150–400)
RBC: 2.86 MIL/uL — ABNORMAL LOW (ref 4.22–5.81)
RDW: 12.5 % (ref 11.5–15.5)
WBC: 9.1 10*3/uL (ref 4.0–10.5)

## 2015-11-10 LAB — URINALYSIS, ROUTINE W REFLEX MICROSCOPIC
Bilirubin Urine: NEGATIVE
Glucose, UA: NEGATIVE mg/dL
Hgb urine dipstick: NEGATIVE
Ketones, ur: 15 mg/dL — AB
LEUKOCYTES UA: NEGATIVE
NITRITE: NEGATIVE
PH: 7 (ref 5.0–8.0)
Protein, ur: NEGATIVE mg/dL
SPECIFIC GRAVITY, URINE: 1.008 (ref 1.005–1.030)

## 2015-11-10 MED ORDER — FERROUS SULFATE 325 (65 FE) MG PO TABS: 325.0000 mg | ORAL_TABLET | Freq: Two times a day (BID) | ORAL | Status: DC

## 2015-11-10 MED ORDER — FERROUS SULFATE 325 (65 FE) MG PO TABS
325.0000 mg | ORAL_TABLET | Freq: Three times a day (TID) | ORAL | Status: DC
  Administered 2015-11-10 – 2015-11-13 (×11): 325 mg via ORAL
  Filled 2015-11-10 (×11): qty 1

## 2015-11-10 MED ORDER — INFLUENZA VAC SPLIT QUAD 0.5 ML IM SUSY: 0.5000 mL | PREFILLED_SYRINGE | INTRAMUSCULAR | Status: AC | PRN

## 2015-11-10 MED ORDER — TAMSULOSIN HCL 0.4 MG PO CAPS
0.4000 mg | ORAL_CAPSULE | Freq: Every day | ORAL | Status: DC
  Administered 2015-11-10 – 2015-11-12 (×3): 0.4 mg via ORAL
  Filled 2015-11-10 (×3): qty 1

## 2015-11-10 NOTE — Progress Notes (Signed)
Physical Therapy Treatment Patient Details Name: Bryan Monroe MRN: [id number] DOB: [birth year] Today's Date: 11/10/2015    History of Present Illness 20 y.o. male who sustained R proximal comminuted femur fx when he fell approx 20 feet from a roof. He underwent IM nailing 11-08-15.    PT Comments    Patient progressing slowly towards PT goals. Tolerated gait training with RW requiring max cues for sequencing and Min A for balance. Able to maintain NWB RLE during ambulation. Pain limiting distance. Discussed ways to negotiate stairs to get into home. Will need to practice next session. Instructed pt in exercises to perform to improve strength and decrease swelling/edema. Will continue to follow.   Follow Up Recommendations  Home health PT;Supervision for mobility/OOB     Equipment Recommendations  Rolling walker with 5" wheels;Wheelchair (measurements PT);Wheelchair cushion (measurements PT) (hospital bed)    Recommendations for Other Services       Precautions / Restrictions Precautions Precautions: Fall Restrictions Weight Bearing Restrictions: Yes RLE Weight Bearing: Non weight bearing    Mobility  Bed Mobility Overal bed mobility: Needs Assistance Bed Mobility: Supine to Sit     Supine to sit: +2 for physical assistance;HOB elevated;Max assist     General bed mobility comments: Cues for sequencing, increased time, assist for R LE, to raise trunk and scoot hips to EOB. Increased time. USe of rail and trapeze bar.  Transfers Overall transfer level: Needs assistance Equipment used: Rolling walker (2 wheeled) Transfers: Sit to/from Stand Sit to Stand: Mod assist;+2 physical assistance         General transfer comment: Cues for hand placement, technique and to maintain NWB status, assist to rise and steady.   Ambulation/Gait Ambulation/Gait assistance: Min assist Ambulation Distance (Feet): 20 Feet Assistive device: Rolling walker (2 wheeled) Gait  Pattern/deviations: Step-to pattern;Trunk flexed ("hop to") Gait velocity: decreased Gait velocity interpretation: Below normal speed for age/gender General Gait Details: Cues for sequencing with RW; able to maintain NWB RLE during gait training. Fatigues.    Stairs            Wheelchair Mobility    Modified Rankin (Stroke Patients Only)       Balance Overall balance assessment: Needs assistance Sitting-balance support: Feet supported;Bilateral upper extremity supported Sitting balance-Leahy Scale: Fair Sitting balance - Comments: limited due to pain, props on one hand Postural control: Left lateral lean Standing balance support: During functional activity;Bilateral upper extremity supported Standing balance-Leahy Scale: Poor Standing balance comment: Heavy reliance on BUEs for support.                    Cognition Arousal/Alertness: Awake/alert Behavior During Therapy: Flat affect Overall Cognitive Status: Within Functional Limits for tasks assessed                      Exercises General Exercises - Lower Extremity Ankle Circles/Pumps: Both;10 reps;Supine Quad Sets: Both;10 reps;Supine Gluteal Sets: Both;10 reps;Supine    General Comments General comments (skin integrity, edema, etc.): Mother present during session.      Pertinent Vitals/Pain Pain Assessment: 0-10 Pain Score: 5  Pain Location: RLE Pain Descriptors / Indicators: Aching;Crying;Guarding;Grimacing;Constant Pain Intervention(s): Monitored during session;Repositioned;Limited activity within patient's tolerance;Premedicated before session;Ice applied    Home Living                      Prior Function            PT Goals (current goals can now be  found in the care plan section) Progress towards PT goals: Progressing toward goals    Frequency    Min 6X/week      PT Plan Current plan remains appropriate    Co-evaluation PT/OT/SLP Co-Evaluation/Treatment:  Yes Reason for Co-Treatment: For patient/therapist safety PT goals addressed during session: Mobility/safety with mobility;Strengthening/ROM;Proper use of DME OT goals addressed during session: Proper use of Adaptive equipment and DME;ADL's and self-care     End of Session Equipment Utilized During Treatment: Gait belt Activity Tolerance: Patient limited by pain Patient left: in chair;with call bell/phone within reach;with family/visitor present     Time: 4098-11910903-0937 PT Time Calculation (min) (ACUTE ONLY): 34 min  Charges:  $Gait Training: 8-22 mins                    G Codes:      Hesper Venturella A Kaegan Stigler 11/10/2015, 10:30 AM Mylo RedShauna Cayce Quezada, PT, DPT [phone]

## 2015-11-10 NOTE — Progress Notes (Signed)
    Subjective: 3 Days Post-Op Procedure(s) (LRB): INTRAMEDULLARY (IM) NAIL INTERTROCHANTRIC (Right) Patient reports pain as 5 on 0-10 scale.   Denies CP or SOB.  Voiding without difficulty. Positive flatus. Objective: Vital signs in last 24 hours: Temp:  [98.4 F (36.9 C)-99 F (37.2 C)] 98.5 F (36.9 C) (09/18 0349) Pulse Rate:  [95-109] 109 (09/18 0349) BP: (115-124)/(64-71) 115/64 (09/18 0349) SpO2:  [96 %-100 %] 100 % (09/18 0349)  Intake/Output from previous day: 09/17 0701 - 09/18 0700 In: 655 [I.V.:655] Out: 1150 [Urine:1150] Intake/Output this shift: No intake/output data recorded.  Labs:  Recent Labs  11/07/15 1314 11/08/15 0328 11/09/15 0355 11/10/15 0239  HGB 15.9 12.4* 9.4* 8.8*    Recent Labs  11/09/15 0355 11/10/15 0239  WBC 9.7 9.1  RBC 3.06* 2.86*  HCT 27.2* 25.4*  PLT 137* 146*    Recent Labs  11/08/15 0328 11/09/15 0355  NA 139 136  K 5.0 3.7  CL 104 104  CO2 28 27  BUN 9 <5*  CREATININE 0.84  0.77 0.81  GLUCOSE 108* 94  CALCIUM 8.7* 7.8*    Recent Labs  11/07/15 1314  INR 1.14    Physical Exam: Neurologically intact ABD soft Intact pulses distally Incision: dressing C/D/I Compartment soft  Assessment/Plan: 3 Days Post-Op Procedure(s) (LRB): INTRAMEDULLARY (IM) NAIL INTERTROCHANTRIC (Right) Advance diet Up with therapy  Patient with inability to void.  No focal neuro deficits.  Plan on foley to remain and obtain Urology consult Poor progress with therapy - will consider inpatient rehab. FeSo4 supplement for anemia Monitor HCT and clinical exam  Letonia Stead D for Dr. Venita Lickahari Gargi Berch [facility] [phone] 11/10/2015, 12:18 PM

## 2015-11-10 NOTE — Progress Notes (Signed)
Occupational Therapy Treatment Patient Details Name: Bryan Monroe MRN: [id number] DOB: 03-18-1995 Today's Date: 11/10/2015    History of present illness 20 y.o. male who sustained R proximal comminuted femur fx when he fell approx 20 feet from a roof. He underwent IM nailing 11-08-15.   OT comments  Pt with decreased activity tolerance with pain rated at 5/10 and increased guarding. Managed to walk to door with chair following and one person assist maintaining NWB. Continues to need 2 person assist for bed mobility. Pt is not ready to d/c home at this point.  Follow Up Recommendations  Home health OT;Supervision/Assistance - 24 hour    Equipment Recommendations  3 in 1 bedside comode;Tub/shower seat;Wheelchair (measurements OT);Wheelchair cushion (measurements OT)    Recommendations for Other Services      Precautions / Restrictions Precautions Precautions: Fall Restrictions Weight Bearing Restrictions: Yes RLE Weight Bearing: Non weight bearing       Mobility Bed Mobility Overal bed mobility: Needs Assistance Bed Mobility: Supine to Sit     Supine to sit: +2 for physical assistance;HOB elevated;Max assist     General bed mobility comments: Cues for sequencing, increased time, assist for R LE, to raise trunk and scoot hips to EOB  Transfers   Equipment used: Rolling walker (2 wheeled) Transfers: Sit to/from Stand Sit to Stand: Mod assist;+2 physical assistance         General transfer comment: Cues for hand placement, technique and to maintain NWB status, assist to rise and steady    Balance     Sitting balance-Leahy Scale: Fair Sitting balance - Comments: limited due to pain, props on one hand     Standing balance-Leahy Scale: Poor Standing balance comment: heavy reliance on B UEs                   ADL Overall ADL's : Needs assistance/impaired     Grooming: Wash/dry hands;Wash/dry face;Set up;Sitting;Oral care                   Toilet  Transfer: Minimal assistance;Ambulation;RW;+2 for safety/equipment Toilet Transfer Details (indicate cue type and reason): simulated to chair         Functional mobility during ADLs: Minimal assistance;+2 for safety/equipment;Cueing for sequencing;Rolling walker General ADL Comments: Pt with heavy guarding, does not participate in mobility as much as he is physically capable due to pain.      Vision                     Perception     Praxis      Cognition   Behavior During Therapy: Flat affect Overall Cognitive Status: Within Functional Limits for tasks assessed                       Extremity/Trunk Assessment               Exercises     Shoulder Instructions       General Comments      Pertinent Vitals/ Pain       Pain Assessment: 0-10 Pain Score: 5  Pain Location: R LE Pain Descriptors / Indicators: Aching;Crying;Guarding;Grimacing;Constant Pain Intervention(s): Limited activity within patient's tolerance;Monitored during session;Premedicated before session;Repositioned;Ice applied  Home Living  Prior Functioning/Environment              Frequency  Min 3X/week        Progress Toward Goals  OT Goals(current goals can now be found in the care plan section)  Progress towards OT goals: Progressing toward goals  Acute Rehab OT Goals Time For Goal Achievement: 11/23/15 Potential to Achieve Goals: Good  Plan Discharge plan remains appropriate    Co-evaluation    PT/OT/SLP Co-Evaluation/Treatment: Yes Reason for Co-Treatment: For patient/therapist safety   OT goals addressed during session: Proper use of Adaptive equipment and DME;ADL's and self-care      End of Session Equipment Utilized During Treatment: Gait belt;Rolling walker   Activity Tolerance Patient limited by pain   Patient Left in chair;with call bell/phone within reach;with family/visitor present    Nurse Communication Mobility status        Time: 1610-9604 OT Time Calculation (min): 33 min  Charges: OT General Charges $OT Visit: 1 Procedure OT Treatments $Therapeutic Activity: 8-22 mins  Evern Bio 11/10/2015, 10:25 AM  [phone]

## 2015-11-10 NOTE — Consult Note (Addendum)
Consult: urinary retention Requested by: Dr. Venita Lickahari Brooks  History of Present Illness: 20 yo WM s/p fall, pelvic fx and right femur fx s/p nail (POD#2). Post-op pelvic xray showed distended bladder with a near perfect cystogram (from prior CT contrast). Also, CT A/P showed nl kidneys and bladder. A foley was placed at some point and removed. He voided about 100 ml after foley removed (urine has been clear) but had high residuals. Foley replaced. UA clear. He's not been ambulating very well and has not had a BM.   Past Medical History:  Diagnosis Date  . Malignant hyperthermia    Father with history of MH   History reviewed. No pertinent surgical history.  Home Medications:  No prescriptions prior to admission.   Allergies: No Known Allergies  No family history on file. Social History:  reports that he has been smoking Cigarettes.  He has never used smokeless tobacco. He reports that he drinks alcohol. He reports that he uses drugs, including Marijuana.  ROS: A complete review of systems was performed.  All systems are negative except for pertinent findings as noted. ROS   Physical Exam:  Vital signs in last 24 hours: Temp:  [98.5 F (36.9 C)-99 F (37.2 C)] 98.7 F (37.1 C) (09/18 1430) Pulse Rate:  [92-109] 92 (09/18 1430) BP: (115-124)/(64-66) 119/64 (09/18 1430) SpO2:  [96 %-100 %] 97 % (09/18 1430) General:  Alert and oriented, No acute distress HEENT: Normocephalic, atraumatic Neck: No JVD or lymphadenopathy Cardiovascular: Regular rate and rhythm Lungs: Regular rate and effort Abdomen: Soft, nontender, nondistended, no abdominal masses Back: No CVA tenderness Extremities: No edema Neurologic: Grossly intact, MAE x 4  GU: foley in place, urine clear  Laboratory Data:  Results for orders placed or performed during the hospital encounter of 11/07/15 (from the past 24 hour(s))  CBC     Status: Abnormal   Collection Time: 11/10/15  2:39 AM  Result Value Ref Range   WBC 9.1 4.0 - 10.5 K/uL   RBC 2.86 (L) 4.22 - 5.81 MIL/uL   Hemoglobin 8.8 (L) 13.0 - 17.0 g/dL   HCT 16.125.4 (L) 09.639.0 - 04.552.0 %   MCV 88.8 78.0 - 100.0 fL   MCH 30.8 26.0 - 34.0 pg   MCHC 34.6 30.0 - 36.0 g/dL   RDW 40.912.5 81.111.5 - 91.415.5 %   Platelets 146 (L) 150 - 400 K/uL  Urinalysis, Routine w reflex microscopic (not at [facility])     Status: Abnormal   Collection Time: 11/10/15  3:20 PM  Result Value Ref Range   Color, Urine YELLOW YELLOW   APPearance CLEAR CLEAR   Specific Gravity, Urine 1.008 1.005 - 1.030   pH 7.0 5.0 - 8.0   Glucose, UA NEGATIVE NEGATIVE mg/dL   Hgb urine dipstick NEGATIVE NEGATIVE   Bilirubin Urine NEGATIVE NEGATIVE   Ketones, ur 15 (A) NEGATIVE mg/dL   Protein, ur NEGATIVE NEGATIVE mg/dL   Nitrite NEGATIVE NEGATIVE   Leukocytes, UA NEGATIVE NEGATIVE   No results found for this or any previous visit (from the past 240 hour(s)). Creatinine:  Recent Labs  11/07/15 1314 11/08/15 0328 11/09/15 0355  CREATININE 0.91 0.84  0.77 0.81   I reviewed CT and pelvic xrays.   Impression/Assessment:  Urinary retention - very common in this situation. No sign of bladder injury.   Plan:  -start tamsulosin (discussed nature r/b of alpha blockers), void trial / d/c foley when he is ambulating better and had a BM.   Daesia Zylka  11/10/2015, 6:20 PM

## 2015-11-10 NOTE — Care Management Note (Signed)
Case Management Note  Patient Details  Name: Bryan Monroe MRN: [id number] Date of Birth: 04-16-1995  Subjective/Objective:  20 yr old young man s/p right femur IM Nailing.   Action/Plan: Case manager spoke with patient and his mom concerning home health and DME needs. Patient has [employer] insurance, Sports coachcase manager explained process for arranging home health through them. [name] requests that CM let's [employer] intake know she would like to use [facility]. CM provided name of contact person at [facility]: Victorino DikeJennifer MVHQIO-[phone], She also states she will be arranging for her son's post-op care to be managed [facility] and she will provide Dr. Shon BatonBrooks office with that information. Patient will be going to his [place], [place]. DME has been ordered.        Expected Discharge Date:   11/12/15               Expected Discharge Plan:  Home w Home Health Services  In-House Referral:     Discharge planning Services  CM Consult  Post Acute Care Choice:  Durable Medical Equipment, Home Health Choice offered to:  Parent, Patient  DME Arranged:  3-N-1, Hospital bed, Wheelchair manual, Tub bench DME Agency:     HH Arranged:  PT HH Agency:   (to be arranged by [employer])  Status of Service:  In process, will continue to follow  If discussed at Long Length of Stay Meetings, dates discussed:    Additional Comments:  Durenda GuthrieBrady, Emmerie Battaglia Naomi, RN 11/10/2015, 11:32 AM

## 2015-11-11 ENCOUNTER — Inpatient Hospital Stay (HOSPITAL_COMMUNITY): Payer: Managed Care, Other (non HMO)

## 2015-11-11 MED ORDER — ENSURE ENLIVE PO LIQD
237.0000 mL | Freq: Two times a day (BID) | ORAL | Status: DC
  Administered 2015-11-11 – 2015-11-12 (×2): 237 mL via ORAL

## 2015-11-11 MED ORDER — FLEET ENEMA 7-19 GM/118ML RE ENEM
1.0000 | ENEMA | Freq: Once | RECTAL | Status: AC
  Administered 2015-11-11: 1 via RECTAL
  Filled 2015-11-11: qty 1

## 2015-11-11 MED ORDER — MAGNESIUM CITRATE PO SOLN
0.5000 | Freq: Once | ORAL | Status: AC
  Administered 2015-11-11: 0.5 via ORAL
  Filled 2015-11-11: qty 296

## 2015-11-11 NOTE — Progress Notes (Signed)
Physical Therapy Treatment Patient Details Name: Bryan Monroe MRN: [id number] DOB: 02/07/1996 Today's Date: 11/11/2015    History of Present Illness 20 y.o. male who sustained R proximal comminuted femur fx when he fell approx 20 feet from a roof. He underwent IM nailing 11-08-15.    PT Comments    Patient reports nausea today- of note, pt has not had a BM in a few days. Encouraged increasing mobility to help with this. Better able to mobilize today with encouragement. Improved ambulation distance. Motivated to improve balance/mobility to be able to transition to crutches. Will continue to follow to maximize independence and mobility. Will need to navigate stairs to get into home.  Follow Up Recommendations  Home health PT;Supervision for mobility/OOB     Equipment Recommendations  Rolling walker with 5" wheels;Wheelchair (measurements PT);Wheelchair cushion (measurements PT);Hospital bed    Recommendations for Other Services       Precautions / Restrictions Precautions Precautions: Fall Restrictions Weight Bearing Restrictions: Yes RLE Weight Bearing: Non weight bearing    Mobility  Bed Mobility Overal bed mobility: Needs Assistance Bed Mobility: Supine to Sit;Sidelying to Sit   Sidelying to sit: Mod assist;+2 for physical assistance;HOB elevated       General bed mobility comments: Cues for sequencing, increased time, assist for R LE,  and to raise trunk. Increased time. Use of rail. Lots of pain.  Transfers Overall transfer level: Needs assistance Equipment used: Rolling walker (2 wheeled) Transfers: Sit to/from Stand Sit to Stand: Min assist         General transfer comment: Cues for hand placement, technique and to maintain NWB status, assist to rise and steady.   Ambulation/Gait Ambulation/Gait assistance: Min assist Ambulation Distance (Feet): 40 Feet Assistive device: Rolling walker (2 wheeled) Gait Pattern/deviations: Trunk flexed (" hop to") Gait  velocity: decreased Gait velocity interpretation: Below normal speed for age/gender General Gait Details: Cues for sequencing with RW; able to maintain NWB RLE during gait training. Fatigues.    Stairs            Wheelchair Mobility    Modified Rankin (Stroke Patients Only)       Balance Overall balance assessment: Needs assistance Sitting-balance support: Feet supported;Single extremity supported Sitting balance-Leahy Scale: Fair Sitting balance - Comments: limited due to pain, props on one hand   Standing balance support: During functional activity Standing balance-Leahy Scale: Poor                      Cognition Arousal/Alertness: Awake/alert Behavior During Therapy: Flat affect Overall Cognitive Status: Within Functional Limits for tasks assessed                      Exercises General Exercises - Lower Extremity Ankle Circles/Pumps: Both;10 reps;Supine    General Comments General comments (skin integrity, edema, etc.): Mother present during session. Pt reports feeling nauseous during session - has not had a BM in few days.      Pertinent Vitals/Pain Pain Assessment: 0-10 Pain Score: 5  Pain Location: RLE Pain Descriptors / Indicators: Sore;Guarding;Grimacing;Crying Pain Intervention(s): Monitored during session;Repositioned;Limited activity within patient's tolerance;Premedicated before session    Home Living                      Prior Function            PT Goals (current goals can now be found in the care plan section) Progress towards PT goals: Progressing toward goals  Frequency    Min 6X/week      PT Plan Current plan remains appropriate    Co-evaluation             End of Session Equipment Utilized During Treatment: Gait belt Activity Tolerance: Patient limited by pain;Patient limited by fatigue Patient left: in chair;with call bell/phone within reach;with family/visitor present     Time:  0930-1007 PT Time Calculation (min) (ACUTE ONLY): 37 min  Charges:  $Gait Training: 8-22 mins $Therapeutic Activity: 8-22 mins                    G Codes:      Shamiya Demeritt A Zoe Nordin 11/11/2015, 10:52 AM  Mylo RedShauna Avier Jech, PT, DPT [phone]

## 2015-11-11 NOTE — Care Management (Signed)
Case manager received call from [facility] stating they are trying to staff for Home Health. CM informed rep that patient's mom has requested [facility], information was provided to [facility] on 11/10/15. CM [facility] the [facility] [phone]. CM Will continue to monitor.

## 2015-11-11 NOTE — Progress Notes (Signed)
    Subjective: 4 Days Post-Op Procedure(s) (LRB): INTRAMEDULLARY (IM) NAIL INTERTROCHANTRIC (Right) Patient reports pain as 4 on 0-10 scale.   Denies CP or SOB.  Voiding without difficulty. Positive flatus. Objective: Vital signs in last 24 hours: Temp:  [98.1 F (36.7 C)-98.7 F (37.1 C)] 98.4 F (36.9 C) (09/19 0724) Pulse Rate:  [92-104] 104 (09/19 0724) Resp:  [16] 16 (09/19 0724) BP: (117-125)/(59-64) 125/59 (09/19 0724) SpO2:  [96 %-98 %] 98 % (09/19 0724)  Intake/Output from previous day: 09/18 0701 - 09/19 0700 In: 1785 [I.V.:1785] Out: 1800 [Urine:1800] Intake/Output this shift: Total I/O In: -  Out: 700 [Urine:700]  Labs:  Recent Labs  11/09/15 0355 11/10/15 0239  HGB 9.4* 8.8*    Recent Labs  11/09/15 0355 11/10/15 0239  WBC 9.7 9.1  RBC 3.06* 2.86*  HCT 27.2* 25.4*  PLT 137* 146*    Recent Labs  11/09/15 0355  NA 136  K 3.7  CL 104  CO2 27  BUN <5*  CREATININE 0.81  GLUCOSE 94  CALCIUM 7.8*   No results for input(s): LABPT, INR in the last 72 hours.  Physical Exam: Neurologically intact Neurovascular intact Intact pulses distally Incision: dressing C/D/I and no drainage Compartment soft  Assessment/Plan: 4 Days Post-Op Procedure(s) (LRB): INTRAMEDULLARY (IM) NAIL INTERTROCHANTRIC (Right) Advance diet Up with therapy  Xray of pelvis stable.  Will order full length femur xrays Constipation: enema and mag citrate Appreciate urology eval - continue foley Will discuss care with [facility] - per patients mother - request [place] with Dr Tiburcio PeaHarris.  Will try to facilitate this for them. Letter concerning college has been completed - patient's mother to pick it up from my office Possible d/c to home tomorrow [facility]. Continue FeSo4 for post-operative blood loss anemia / also post-traumatic blood loss. Recheck HCT in AM  Taleisha Kaczynski D for Dr. Venita Lickahari Paul [facility]y [facility] [phone] 11/11/2015, 12:19 PM

## 2015-11-11 NOTE — Progress Notes (Signed)
Occupational Therapy Treatment Patient Details Name: Bryan Monroe MRN: [id number] DOB: 14-Apr-1995 Today's Date: 11/11/2015    History of present illness 20 y.o. male who sustained R proximal comminuted femur fx when he fell approx 20 feet from a roof. He underwent IM nailing 11-08-15.   OT comments  This 20 yo male admitted and underwent above presents to acute OT with continued deficits in general mobility and basic ADLs. He will continue to benefit from acute OT with follow up HHOT. Pt's biggest limitation is pain.  Follow Up Recommendations  Home health OT;Supervision/Assistance - 24 hour    Equipment Recommendations  3 in 1 bedside comode;Tub/shower bench;Wheelchair (measurements OT);Wheelchair cushion (measurements OT)       Precautions / Restrictions Precautions Precautions: Fall Restrictions Weight Bearing Restrictions: Yes RLE Weight Bearing: Non weight bearing       Mobility Bed Mobility Overal bed mobility: Needs Assistance Bed Mobility: Sit to Supine       Sit to supine: Min assist   General bed mobility comments: for RLE  Transfers Overall transfer level: Needs assistance Equipment used: Rolling walker (2 wheeled) Transfers: Sit to/from Stand Sit to Stand: Mod assist (from recliner after being up for 2+hours)              Balance Overall balance assessment: Needs assistance Sitting-balance support: Single extremity supported;Feet supported Sitting balance-Leahy Scale: Poor Sitting balance - Comments: limited due to pain, props on one hand   Standing balance support: During functional activity;Bilateral upper extremity supported Standing balance-Leahy Scale: Poor Standing balance comment: heavy reliance on Bil UEs for support                   ADL Overall ADL's : Needs assistance/impaired                     Lower Body Dressing: Maximal assistance (mod A sit<>stand; introduced use of AE for donning shorts and donning socks)    Toilet Transfer: Minimal assistance (mod A sit<>stand from recliner , then min A stand pivot from recliner to bed)                              Cognition   Behavior During Therapy: Flat affect Overall Cognitive Status: Within Functional Limits for tasks assessed                                    Pertinent Vitals/ Pain       Pain Assessment: 0-10 Pain Score: 5  Pain Location: RLE Pain Descriptors / Indicators: Aching;Grimacing;Guarding;Operative site guarding Pain Intervention(s): Monitored during session;Repositioned         Frequency  Min 3X/week        Progress Toward Goals  OT Goals(current goals can now be found in the care plan section)  Progress towards OT goals: Progressing toward goals (slowly)     Plan Discharge plan remains appropriate       End of Session Equipment Utilized During Treatment: Gait belt;Rolling walker   Activity Tolerance Patient limited by pain   Patient Left in bed;with call bell/phone within reach;with family/visitor present   Nurse Communication          Time: 4098-11911216-1256 OT Time Calculation (min): 40 min  Charges: OT General Charges $OT Visit: 1 Procedure OT Treatments $Self Care/Home Management : 38-52 mins  Evette GeorgesLeonard, Tekela Garguilo Eva  [phone] 11/11/2015, 2:56 PM

## 2015-11-12 ENCOUNTER — Encounter (HOSPITAL_COMMUNITY): Payer: Self-pay | Admitting: Orthopedic Surgery

## 2015-11-12 MED ORDER — INFLUENZA VAC SPLIT QUAD 0.5 ML IM SUSY
0.5000 mL | PREFILLED_SYRINGE | INTRAMUSCULAR | Status: AC
  Administered 2015-11-13: 0.5 mL via INTRAMUSCULAR
  Filled 2015-11-12: qty 0.5

## 2015-11-12 MED ORDER — PNEUMOCOCCAL VAC POLYVALENT 25 MCG/0.5ML IJ INJ
0.5000 mL | INJECTION | INTRAMUSCULAR | Status: AC
  Administered 2015-11-13: 0.5 mL via INTRAMUSCULAR
  Filled 2015-11-12: qty 0.5

## 2015-11-12 NOTE — Progress Notes (Signed)
Occupational Therapy Treatment Patient Details Name: Bryan Monroe MRN: [id number] DOB: 1995-09-22 Today's Date: 11/12/2015    History of present illness 20 y.o. male who sustained R proximal comminuted femur fx when he fell approx 20 feet from a roof. He underwent IM nailing 11-08-15.   OT comments  Pt continues to make steady progress towards OT goals. Pt requires increased time and cues for transfers in addition to min assist to keep R foot off ground to maintain NWB RLE. Confirmed DME pt will have at home and practiced using 3in1 for toilet transfer and trapeze for bed mobility. Continued practice with reacher for LB dressing tasks. Will continue to follow acutely.   Follow Up Recommendations  Home health OT;Supervision/Assistance - 24 hour    Equipment Recommendations  3 in 1 bedside comode;Tub/shower bench;Wheelchair (measurements OT);Wheelchair cushion (measurements OT)    Recommendations for Other Services      Precautions / Restrictions Precautions Precautions: Fall Restrictions Weight Bearing Restrictions: Yes RLE Weight Bearing: Non weight bearing       Mobility Bed Mobility Overal bed mobility: Needs Assistance Bed Mobility: Sit to Supine       Sit to supine: Min assist   General bed mobility comments: Assist to bring RLE onto bed. VCs for hand placement and to scoot hips back.  Transfers Overall transfer level: Needs assistance Equipment used: Rolling walker (2 wheeled) Transfers: Sit to/from Stand Sit to Stand: Min assist         General transfer comment: Assist to keep R foot off ground during transfers. VCs for safe hand placement.    Balance Overall balance assessment: Needs assistance Sitting-balance support: No upper extremity supported;Feet supported Sitting balance-Leahy Scale: Fair     Standing balance support: Bilateral upper extremity supported;During functional activity Standing balance-Leahy Scale: Poor                      ADL       Grooming: Wash/dry hands;Min guard;Standing   Upper Body Bathing: Set up;Sitting   Lower Body Bathing: Sit to/from stand;Moderate assistance;Cueing for compensatory techniques   Upper Body Dressing : Set up;Sitting   Lower Body Dressing: Moderate assistance;Sit to/from stand;Cueing for compensatory techniques;With adaptive equipment Lower Body Dressing Details (indicate cue type and reason): cues to dress RLE first and to undress it last; cues for proper use of reacher Toilet Transfer: Minimal assistance;Cueing for safety;Ambulation;BSC;RW   Toileting- Clothing Manipulation and Hygiene: Minimal assistance;Sit to/from stand Toileting - Clothing Manipulation Details (indicate cue type and reason): assist for thorough pericare   Tub/Shower Transfer Details (indicate cue type and reason): pt's mother confirmed that pt will have tub bench delivered to the house eventually Functional mobility during ADLs: Minimal assistance;Rolling walker        Vision                     Perception     Praxis      Cognition   Behavior During Therapy: [facility] for tasks assessed/performed Overall Cognitive Status: Within Functional Limits for tasks assessed                       Extremity/Trunk Assessment               Exercises     Shoulder Instructions       General Comments      Pertinent Vitals/ Pain       Pain Assessment: 0-10 Pain Score: 3  Pain Location: RLE Pain Descriptors / Indicators: Aching;Sore;Spasm Pain Intervention(s): Limited activity within patient's tolerance;Monitored during session;Repositioned;Premedicated before session;Ice applied  Home Living                                          Prior Functioning/Environment              Frequency  Min 3X/week        Progress Toward Goals  OT Goals(current goals can now be found in the care plan section)  Progress towards OT goals: Progressing toward  goals  Acute Rehab OT Goals Patient Stated Goal: return to college next semester OT Goal Formulation: With patient Time For Goal Achievement: 11/23/15 Potential to Achieve Goals: Good ADL Goals Pt Will Perform Upper Body Bathing: with modified independence;sitting Pt Will Perform Lower Body Bathing: with min assist;sit to/from stand Pt Will Transfer to Toilet: with min assist;stand pivot transfer;bedside commode Pt Will Perform Toileting - Clothing Manipulation and hygiene: with min assist;sit to/from stand Pt/caregiver will Perform Home Exercise Program: Increased strength;Both right and left upper extremity;With theraband;With written HEP provided;Independently  Plan Discharge plan remains appropriate    Co-evaluation                 End of Session Equipment Utilized During Treatment: Gait belt;Rolling walker   Activity Tolerance Patient tolerated treatment well   Patient Left in bed;with call bell/phone within reach;with family/visitor present   Nurse Communication Mobility status        Time: 1358-1446 OT Time Calculation (min): 48 min  Charges: OT General Charges $OT Visit: 1 Procedure OT Treatments $Self Care/Home Management : 38-52 mins  Nils Pyle, OTR/L Pager: [phone] 11/12/2015, 4:28 PM

## 2015-11-12 NOTE — Progress Notes (Signed)
Physical Therapy Treatment Patient Details Name: Bryan Monroe MRN: [id number] DOB: 04-02-1995 Today's Date: 11/12/2015    History of Present Illness 20 y.o. male who sustained R proximal comminuted femur fx when he fell approx 20 feet from a roof. He underwent IM nailing 11-08-15.    PT Comments    Patient progressing slowly towards PT goals. Requires increased time and cues to get to EOB with Min A to support RLE. Performed family training with mother present on how to assist at home. Problem solved room setup, barriers and modifications for home. Pt will have ramp and w/c at home to get into home. Reviewed exercises. Improved ambutaion distance but requires seated rest break due to fatigue. Will follow.   Follow Up Recommendations  Home health PT;Supervision for mobility/OOB     Equipment Recommendations  Rolling walker with 5" wheels;Wheelchair (measurements PT);Wheelchair cushion (measurements PT);Hospital bed    Recommendations for Other Services       Precautions / Restrictions Precautions Precautions: Fall Restrictions Weight Bearing Restrictions: Yes RLE Weight Bearing: Non weight bearing    Mobility  Bed Mobility Overal bed mobility: Needs Assistance Bed Mobility: Supine to Sit   Sidelying to sit: Min assist;HOB elevated       General bed mobility comments: Increased time and cues for sequencing, assist with guiding RLE to EOB. use of rail (pt will have hospital bed at home).   Transfers Overall transfer level: Needs assistance Equipment used: Rolling walker (2 wheeled) Transfers: Sit to/from Stand Sit to Stand: Min assist         General transfer comment: Cues for hand placement, technique and to maintain NWB status, assist to rise and steady. Increased time.   Ambulation/Gait Ambulation/Gait assistance: Min assist Ambulation Distance (Feet): 25 Feet (+ 30') Assistive device: Rolling walker (2 wheeled) Gait Pattern/deviations: Step-to pattern;Trunk  flexed ("hop to") Gait velocity: decreased Gait velocity interpretation: Below normal speed for age/gender General Gait Details: Cues for sequencing with RW; able to maintain NWB RLE during gait training. Fatigues. 1 seated rest break needed due to dizziness.   Stairs            Wheelchair Mobility    Modified Rankin (Stroke Patients Only)       Balance Overall balance assessment: Needs assistance Sitting-balance support: Feet supported;No upper extremity supported Sitting balance-Leahy Scale: Fair     Standing balance support: During functional activity Standing balance-Leahy Scale: Poor Standing balance comment: Reliant on BUEs for support.                    Cognition Arousal/Alertness: Awake/alert Behavior During Therapy: Flat affect Overall Cognitive Status: Within Functional Limits for tasks assessed                      Exercises General Exercises - Lower Extremity Ankle Circles/Pumps: Both;10 reps;Supine Quad Sets: Both;10 reps;Supine    General Comments General comments (skin integrity, edema, etc.): Mother and g/f present during session.      Pertinent Vitals/Pain Pain Assessment: Faces Faces Pain Scale: Hurts whole lot Pain Location: RLE Pain Descriptors / Indicators: Sore;Guarding;Grimacing;Operative site guarding;Crying Pain Intervention(s): Monitored during session;Repositioned;Limited activity within patient's tolerance    Home Living                      Prior Function            PT Goals (current goals can now be found in the care plan section) Progress towards  PT goals: Progressing toward goals    Frequency    Min 6X/week      PT Plan Current plan remains appropriate    Co-evaluation             End of Session Equipment Utilized During Treatment: Gait belt Activity Tolerance: Patient limited by fatigue;Patient limited by pain Patient left: in chair;with call bell/phone within reach;with  family/visitor present     Time: 4098-1191 PT Time Calculation (min) (ACUTE ONLY): 42 min  Charges:  $Gait Training: 23-37 mins $Therapeutic Activity: 8-22 mins                    G Codes:      Marybelle Giraldo A Michael Ventresca 11/12/2015, 11:53 AM Mylo Red, PT, DPT [phone]

## 2015-11-13 LAB — CBC
HEMATOCRIT: 24.4 % — AB (ref 39.0–52.0)
HEMOGLOBIN: 8.2 g/dL — AB (ref 13.0–17.0)
MCH: 30.5 pg (ref 26.0–34.0)
MCHC: 33.6 g/dL (ref 30.0–36.0)
MCV: 90.7 fL (ref 78.0–100.0)
Platelets: 228 10*3/uL (ref 150–400)
RBC: 2.69 MIL/uL — ABNORMAL LOW (ref 4.22–5.81)
RDW: 12.9 % (ref 11.5–15.5)
WBC: 10.1 10*3/uL (ref 4.0–10.5)

## 2015-11-13 MED ORDER — METHOCARBAMOL 500 MG PO TABS: 500.0000 mg | ORAL_TABLET | Freq: Four times a day (QID) | ORAL | 0 refills | Status: DC | PRN

## 2015-11-13 MED ORDER — FERROUS SULFATE 325 (65 FE) MG PO TABS: 325.0000 mg | ORAL_TABLET | Freq: Three times a day (TID) | ORAL | 3 refills | Status: AC

## 2015-11-13 MED ORDER — METHOCARBAMOL 500 MG PO TABS: 500.0000 mg | ORAL_TABLET | Freq: Four times a day (QID) | ORAL | 0 refills | Status: AC | PRN

## 2015-11-13 MED ORDER — ENOXAPARIN SODIUM 40 MG/0.4ML ~~LOC~~ SOLN: 40.0000 mg | SUBCUTANEOUS | 0 refills | Status: AC

## 2015-11-13 MED ORDER — FERROUS SULFATE 325 (65 FE) MG PO TABS: 325.0000 mg | ORAL_TABLET | Freq: Three times a day (TID) | ORAL | 0 refills | Status: AC

## 2015-11-13 MED ORDER — ENOXAPARIN SODIUM 40 MG/0.4ML ~~LOC~~ SOLN: 40.0000 mg | SUBCUTANEOUS | 0 refills | Status: DC

## 2015-11-13 MED ORDER — ONDANSETRON HCL 4 MG PO TABS: 4.0000 mg | ORAL_TABLET | Freq: Four times a day (QID) | ORAL | 0 refills | Status: AC | PRN

## 2015-11-13 MED ORDER — LUBIPROSTONE 24 MCG PO CAPS: 24.0000 ug | ORAL_CAPSULE | Freq: Two times a day (BID) | ORAL | 0 refills | Status: AC

## 2015-11-13 MED ORDER — OXYCODONE-ACETAMINOPHEN 10-325 MG PO TABS: 1.0000 | ORAL_TABLET | ORAL | 0 refills | Status: AC | PRN

## 2015-11-13 MED ORDER — ONDANSETRON 4 MG PO TBDP: 4.0000 mg | ORAL_TABLET | Freq: Three times a day (TID) | ORAL | 0 refills | Status: AC | PRN

## 2015-11-13 MED ORDER — OXYCODONE HCL 5 MG PO TABS: 5.0000 mg | ORAL_TABLET | ORAL | 0 refills | Status: DC | PRN

## 2015-11-13 NOTE — Plan of Care (Signed)
Problem: Safety: Goal: Ability to remain free from injury will improve Outcome: Progressing Safety precautions maintained, no fall or injuries noted this shift  Problem: Pain Managment: Goal: General experience of comfort will improve Outcome: Progressing Medicated once for pain with full relief   Problem: Tissue Perfusion: Goal: Risk factors for ineffective tissue perfusion will decrease Outcome: Progressing No s/s of dvt noted  Problem: Bowel/Gastric: Goal: Will not experience complications related to bowel motility Outcome: Progressing Denies bowel issues

## 2015-11-13 NOTE — Progress Notes (Signed)
Physical Therapy Treatment Patient Details Name: Bryan Monroe MRN: [id number] DOB: 10-14-1995 Today's Date: 11/13/2015    History of Present Illness 20 y.o. male who sustained R proximal comminuted femur fx when he fell approx 20 feet from a roof. He underwent IM nailing 11-08-15.    PT Comments    Pt performed increased mobility, reviewed stairs and reviewed HEP in prep for d/c home today.  Pt performed increased gait distance and PTA issued handouts of stair training and exercises.  Pt reports nausea but after medication delivered symptoms subsided.    Follow Up Recommendations  Home health PT;Supervision for mobility/OOB     Equipment Recommendations  Rolling walker with 5" wheels;Wheelchair (measurements PT);Wheelchair cushion (measurements PT);Hospital bed    Recommendations for Other Services       Precautions / Restrictions Precautions Precautions: Fall Restrictions Weight Bearing Restrictions: Yes RLE Weight Bearing: Non weight bearing    Mobility  Bed Mobility Overal bed mobility: Needs Assistance Bed Mobility: Supine to Sit       Sit to supine: Min assist   General bed mobility comments: Assist for B LEs into bed and to assist when positioning.    Transfers Overall transfer level: Needs assistance Equipment used: Rolling walker (2 wheeled) Transfers: Sit to/from Stand Sit to Stand: Min assist (required assist with RLE to maintain weight bearing.  )         General transfer comment: Assist to keep R foot off ground during transfers. VCs for safe hand placement.  Ambulation/Gait Ambulation/Gait assistance: Min assist Ambulation Distance (Feet): 90 Feet Assistive device: Rolling walker (2 wheeled) Gait Pattern/deviations: Step-to pattern;Trunk flexed;Antalgic Gait velocity: decreased Gait velocity interpretation: Below normal speed for age/gender General Gait Details: Cues for sequencing with RW; able to maintain NWB RLE during gait training. Fatigues.     Stairs Stairs: Yes Stairs assistance: Min assist Stair Management: No rails;With walker Number of Stairs: 2 General stair comments: Cues for sequencing and foot placement to negotiate stairs.  Good technique and maintenance of R NWB.    Wheelchair Mobility    Modified Rankin (Stroke Patients Only)       Balance Overall balance assessment: Needs assistance Sitting-balance support: Bilateral upper extremity supported Sitting balance-Leahy Scale: Fair Sitting balance - Comments: Heavy use of UEs.       Standing balance-Leahy Scale: Poor Standing balance comment: Heavy use of UEs.                      Cognition Arousal/Alertness: Awake/alert Behavior During Therapy: WFL for tasks assessed/performed Overall Cognitive Status: Within Functional Limits for tasks assessed                      Exercises Total Joint Exercises Ankle Circles/Pumps: AROM;Right;10 reps;Supine Quad Sets: AROM;Right;10 reps;Supine Gluteal Sets: AROM;Both;10 reps;Supine Short Arc Quad: Right;10 reps;Supine;AAROM Heel Slides: AAROM;Right;10 reps;Supine Hip ABduction/ADduction: AAROM;Right;10 reps;Supine Long Arc Quad: AAROM;Right;10 reps;Seated    General Comments        Pertinent Vitals/Pain Pain Assessment: 0-10 Faces Pain Scale: Hurts whole lot Pain Location: RLE Pain Descriptors / Indicators: Aching;Spasm;Sore Pain Intervention(s): Monitored during session;Repositioned;Ice applied    Home Living                      Prior Function            PT Goals (current goals can now be found in the care plan section) Acute Rehab PT Goals Patient Stated Goal: return  to college next semester Potential to Achieve Goals: Good Progress towards PT goals: Progressing toward goals    Frequency    Min 6X/week      PT Plan Current plan remains appropriate    Co-evaluation             End of Session Equipment Utilized During Treatment: Gait belt Activity  Tolerance: Patient limited by fatigue;Patient limited by pain Patient left: with call bell/phone within reach;with family/visitor present;in bed     Time: [id number] PT Time Calculation (min) (ACUTE ONLY): 57 min  Charges:  $Gait Training: 23-37 mins $Therapeutic Exercise: 8-22 mins $Therapeutic Activity: 8-22 mins                    G Codes:      Florestine Aversimee J Ameyah Bangura 11/13/2015, 10:49 AM  Joycelyn RuaAimee Airyanna Dipalma, PTA pager [phone]

## 2015-11-13 NOTE — Progress Notes (Signed)
    Subjective: 6 Days Post-Op Procedure(s) (LRB): INTRAMEDULLARY (IM) NAIL INTERTROCHANTRIC (Right) Patient reports pain as 5 on 0-10 scale.   Denies CP or SOB.  Voiding without difficulty. Positive BM.  Pt has been working with PT.  Pt eating well Objective: Vital signs in last 24 hours: Temp:  [98.1 F (36.7 C)-99.5 F (37.5 C)] 99.5 F (37.5 C) (09/20 2226) Pulse Rate:  [88-100] 100 (09/20 2226) Resp:  [18] 18 (09/20 2226) BP: (126-133)/(71-73) 133/71 (09/20 2226) SpO2:  [96 %-99 %] 96 % (09/20 2226)  Intake/Output from previous day: 09/20 0701 - 09/21 0700 In: 660 [P.O.:660] Out: 3350 [Urine:3350] Intake/Output this shift: No intake/output data recorded.  Labs: No results for input(s): HGB in the last 72 hours. No results for input(s): WBC, RBC, HCT, PLT in the last 72 hours. No results for input(s): NA, K, CL, CO2, BUN, CREATININE, GLUCOSE, CALCIUM in the last 72 hours. No results for input(s): LABPT, INR in the last 72 hours.  Physical Exam: Neurologically intact ABD soft Sensation intact distally Dorsiflexion/Plantar flexion intact Incision: no drainage Compartment soft  Assessment/Plan: 6 Days Post-Op Procedure(s) (LRB): INTRAMEDULLARY (IM) NAIL INTERTROCHANTRIC (Right) Pt hopefully will DC today after cleared by PT Crutches for home use have been ordered  Pt will continue to be non weight bearing Dad is putting in a ramp today and family has to borrow a vehicle to transport him home Pt will f/u with Bryan Monroe in 2 weeks Home health will be ordered Medications are on chart ready for DC   Bryan Monroe, Bryan Kailarmen Monroe for Dr. Venita Lickahari Monroe [facility] [phone] 11/13/2015, 7:24 AM    Patient ID: Bryan Monroe, male   DOB: 23-Feb-1995, 20 y.o.   MRN: [id number]

## 2015-11-13 NOTE — Progress Notes (Signed)
    Subjective: 6 Days Post-Op Procedure(s) (LRB): INTRAMEDULLARY (IM) NAIL INTERTROCHANTRIC (Right) Patient reports pain as 4 on 0-10 scale.   Denies CP or SOB.  Voiding without difficulty. Positive BM. Pt denies dizziness or nausea.  Objective: Vital signs in last 24 hours: Temp:  [98.1 F (36.7 C)-99.5 F (37.5 C)] 99.5 F (37.5 C) (09/20 2226) Pulse Rate:  [88-100] 100 (09/20 2226) Resp:  [18] 18 (09/20 2226) BP: (126-133)/(71-73) 133/71 (09/20 2226) SpO2:  [96 %-99 %] 96 % (09/20 2226)  Intake/Output from previous day: 09/20 0701 - 09/21 0700 In: 660 [P.O.:660] Out: 3350 [Urine:3350] Intake/Output this shift: Total I/O In: 240 [P.O.:240] Out: 400 [Urine:400]  Labs:  Recent Labs  11/13/15 0946  HGB 8.2*    Recent Labs  11/13/15 0946  WBC 10.1  RBC 2.69*  HCT 24.4*  PLT 228   No results for input(s): NA, K, CL, CO2, BUN, CREATININE, GLUCOSE, CALCIUM in the last 72 hours. No results for input(s): LABPT, INR in the last 72 hours.  Physical Exam:   Assessment/Plan: 6 Days Post-Op Procedure(s) (LRB): INTRAMEDULLARY (IM) NAIL INTERTROCHANTRIC (Right) Talked to the pts mom - all questions elicited and answered Pts mom would like dr. Shon BatonBrooks to consult with one of the hip specialists Pts hgb is still low 8.2 on todays CBC.  Discussed with Dr Shelle IronBeane.  I will continue his Iron tid at home. Also provided the pt with Amitiza for constipation associated with iron and narcotics. 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  They will f/u with Dr. In 2 weeks in clinic  Demaurion Dicioccio, Baxter Kailarmen Christina for Dr. Venita Lickahari Brooks [facility] [phone] 11/13/2015, 12:44 PM    Patient ID: Bryan SpiceNicholas Fung, male   DOB: 1995-03-05, 20 y.o.   MRN: [id number]

## 2015-11-13 NOTE — Progress Notes (Signed)
Discharge instructions, Follow up appointments, activity restrictions and medications reviewed with the patient and his mother. Both voice understanding to teaching.  To door via wheel chair with the assistance of OT.  Home vis POV with his mother driving.

## 2015-11-13 NOTE — Progress Notes (Signed)
Orthopedic Tech Progress Note Patient Details:  Bryan Monroe [birth year] [id number]  Ortho Devices Type of Ortho Device: Crutches Ortho Device/Splint Interventions: Application   Bryan Monroe 11/13/2015, 10:30 AM

## 2015-11-13 NOTE — Progress Notes (Signed)
Occupational Therapy Treatment/Discharge Patient Details Name: Bryan Monroe MRN: [id number] DOB: [birth year] Today's Date: 11/13/2015    History of present illness 20 y.o. male who sustained R proximal comminuted femur fx when he fell approx 20 feet from a roof. He underwent IM nailing 11-08-15.   OT comments  Assisted pt and pt's mother with transfer to w/c and into car for transport home. Pt continues to require min assist to keep RLE off ground to maintain [place] and mod verbal cues. Educated pt's mother on positioning while transporting home in car and how to complete an anterior-posterior transfer into w/c when exiting the car. Pt's mother very grateful for therapist's assistance with transfer and throughout pt's stay. All education has been completed and pt has no further acute OT needs. OT signing off.   Follow Up Recommendations  Home health OT;Supervision/Assistance - 24 hour    Equipment Recommendations  3 in 1 bedside comode;Tub/shower bench;Wheelchair (measurements OT);Wheelchair cushion (measurements OT)    Recommendations for Other Services      Precautions / Restrictions Precautions Precautions: Fall Restrictions Weight Bearing Restrictions: Yes RLE Weight Bearing: Non weight bearing       Mobility Bed Mobility Overal bed mobility: Needs Assistance Bed Mobility: Supine to Sit     Supine to sit: Min assist     General bed mobility comments: Assist to move RLE while pt scooted to EOB. VCs for hand placement.  Transfers Overall transfer level: Needs assistance Equipment used: Rolling walker (2 wheeled) Transfers: Sit to/from [facility] Sit to Stand: Min assist Stand pivot transfers: Min assist       General transfer comment: Assist to keep R foot off ground to maintain RLE NWB. Pt able to stand and hop to pivot to next surface with min guard assist.    Balance Overall balance assessment: Needs assistance Sitting-balance support: No upper  extremity supported;Feet supported Sitting balance-Leahy Scale: Fair     Standing balance support: Bilateral upper extremity supported;During functional activity Standing balance-Leahy Scale: Poor                     ADL Overall ADL's : Needs assistance/impaired                                     Functional mobility during ADLs: Minimal assistance;Rolling walker General ADL Comments: Assisted pt with transfer from bed to w/c and with car transfer. Educated pt and pt's mother on proper techniquet to get in/out of car and for proper positioning while riding in car home.       Vision                     Perception     Praxis      Cognition   Behavior During Therapy: WFL for tasks assessed/performed Overall Cognitive Status: Within Functional Limits for tasks assessed                       Extremity/Trunk Assessment               Exercises     Shoulder Instructions       General Comments      Pertinent Vitals/ Pain       Pain Assessment: 0-10 Pain Score: 4  Pain Location: RLE Pain Descriptors / Indicators: Aching Pain Intervention(s): Repositioned;Monitored during session  Home Living  Prior Functioning/Environment              Frequency           Progress Toward Goals  OT Goals(current goals can now be found in the care plan section)  Progress towards OT goals: Goals met/education completed, patient discharged from OT  Acute Rehab OT Goals Patient Stated Goal: return to college next semester OT Goal Formulation: With patient Time For Goal Achievement: 11/23/15 Potential to Achieve Goals: Good ADL Goals Pt Will Perform Upper Body Bathing: with modified independence;sitting Pt Will Perform Lower Body Bathing: with min assist;sit to/from stand Pt Will Transfer to Toilet: with min assist;stand pivot transfer;bedside commode Pt Will Perform  Toileting - Clothing Manipulation and hygiene: with min assist;sit to/from stand Pt/caregiver will Perform Home Exercise Program: Increased strength;Both right and left upper extremity;With theraband;With written HEP provided;Independently  Plan All goals met and education completed, patient discharged from OT services    Co-evaluation                 End of Session Equipment Utilized During Treatment: Rolling walker   Activity Tolerance Patient tolerated treatment well   Patient Left Other (comment) (in car ready for d/c)   Nurse Communication Mobility status        Time: 1425-1505 OT Time Calculation (min): 40 min  Charges: OT General Charges $OT Visit: 1 Procedure OT Treatments $Self Care/Home Management : 38-52 mins  Julia Bermel, OTR/L Pager: [phone] 11/13/2015, 3:51 PM    

## 2015-11-17 NOTE — Discharge Summary (Signed)
Physician Discharge Summary  Patient ID: Bryan Monroe MRN: [id number] DOB/AGE: January 14, 1996 20 y.o.  Admit date: 11/07/2015 Discharge date: 11/17/2015  Admission Diagnoses:  Right Femur Fracture  Discharge Diagnoses:  Active Problems:   Femur fracture, right, closed, initial encounter   Femur fracture [facility])   Past Medical History:  Diagnosis Date  . Malignant hyperthermia    Father with history of MH    Surgeries: Procedure(s): INTRAMEDULLARY (IM) NAIL INTERTROCHANTRIC on 11/07/2015 - 11/08/2015   Consultants (if any): Treatment Team:  Venita Lick, MD Jerilee Field, MD  Discharged Condition: Improved  Hospital Course: Bryan Monroe is an 20 y.o. male who was admitted 11/07/2015 with a diagnosis of RIght Femur Fracture and went to the operating room on 11/08/2015 and underwent the above named procedures.  Pt had urology consult while inpatient as he had difficulty urinating post foley.  This was resolved by DC.  Treated opioid induced constipation while inpatient with enema and mag citrate.  This resolved while inpatient.  Pt was eating well.  No nausea.  Pt worked with PT while inpatient.  Crutches and walker provided for pt as he will continue to be NWB after DC.  Pts dad had a ramp installed in tehir home and they rented a [employer] to transport son home.  Dr. Shon Baton contacted Dr. Tiburcio Pea per the moms request.  Dr Tiburcio Pea is going to take over the pts care as he is in town and the pt will not have to make the 2 hour trip to [place].  Pts pain controlled on oral meds.   He was given perioperative antibiotics:  Anti-infectives    Start     Dose/Rate Route Frequency Ordered Stop   11/08/15 0600  ceFAZolin (ANCEF) IVPB 2g/100 mL premix     2 g 200 mL/hr over 30 Minutes Intravenous Every 6 hours 11/08/15 0101 11/08/15 1310   11/07/15 2130  ceFAZolin (ANCEF) IVPB 2g/100 mL premix  Status:  Discontinued     2 g 200 mL/hr over 30 Minutes Intravenous  Once 11/07/15 2056 11/08/15 0102     .  He was given sequential compression devices, early ambulation, and TED for DVT prophylaxis.  He benefited maximally from the hospital stay and there were no complications.    Recent vital signs:  Vitals:   11/12/15 1532 11/12/15 2226  BP: 126/73 133/71  Pulse: 88 100  Resp: 18 18  Temp: 98.1 F (36.7 C) 99.5 F (37.5 C)    Recent laboratory studies:  Lab Results  Component Value Date   HGB 8.2 (L) 11/13/2015   HGB 8.8 (L) 11/10/2015   HGB 9.4 (L) 11/09/2015   Lab Results  Component Value Date   WBC 10.1 11/13/2015   PLT 228 11/13/2015   Lab Results  Component Value Date   INR 1.14 11/07/2015   Lab Results  Component Value Date   NA 136 11/09/2015   K 3.7 11/09/2015   CL 104 11/09/2015   CO2 27 11/09/2015   BUN <5 (L) 11/09/2015   CREATININE 0.81 11/09/2015   GLUCOSE 94 11/09/2015    Discharge Medications:     Medication List    TAKE these medications   enoxaparin 40 MG/0.4ML injection Commonly known as:  LOVENOX Inject 0.4 mLs (40 mg total) into the skin daily.   ferrous sulfate 325 (65 FE) MG tablet Commonly known as:  FERROUSUL Take 1 tablet (325 mg total) by mouth 3 (three) times daily with meals.   ferrous sulfate 325 (65 FE) MG  tablet Take 1 tablet (325 mg total) by mouth 3 (three) times daily with meals.   lubiprostone 24 MCG capsule Commonly known as:  AMITIZA Take 1 capsule (24 mcg total) by mouth 2 (two) times daily with a meal.   lubiprostone 24 MCG capsule Commonly known as:  AMITIZA Take 1 capsule (24 mcg total) by mouth 2 (two) times daily with a meal.   methocarbamol 500 MG tablet Commonly known as:  ROBAXIN Take 1 tablet (500 mg total) by mouth every 6 (six) hours as needed for muscle spasms.   ondansetron 4 MG disintegrating tablet Commonly known as:  ZOFRAN ODT Take 1 tablet (4 mg total) by mouth every 8 (eight) hours as needed for nausea or vomiting.   ondansetron 4 MG tablet Commonly known as:  ZOFRAN Take 1 tablet  (4 mg total) by mouth every 6 (six) hours as needed for nausea.   oxyCODONE-acetaminophen 10-325 MG tablet Commonly known as:  PERCOCET Take 1 tablet by mouth every 4 (four) hours as needed for pain.       Diagnostic Studies: Dg Knee 1-2 Views Right  Result Date: 11/07/2015 CLINICAL DATA:  Initial evaluation for acute trauma, fall. EXAM: RIGHT KNEE - 1-2 VIEW COMPARISON:  None. FINDINGS: No evidence of fracture, dislocation, or joint effusion. No evidence of arthropathy or other focal bone abnormality. Soft tissues are unremarkable. External traction device in place. IMPRESSION: Negative. Electronically Signed   By: Rise Mu M.D.   On: 11/07/2015 14:16   Dg Tibia/fibula Right  Result Date: 11/07/2015 CLINICAL DATA:  Initial evaluation for acute trauma, fall. EXAM: RIGHT TIBIA AND FIBULA - 2 VIEW COMPARISON:  None. FINDINGS: There is no evidence of fracture or other focal bone lesions. Soft tissues are unremarkable. External traction device in place. IMPRESSION: Negative. Electronically Signed   By: Rise Mu M.D.   On: 11/07/2015 14:31   Ct Abdomen Pelvis W Contrast  Result Date: 11/07/2015 CLINICAL DATA:  Fall 20 foot from roof.  Right hip pain. EXAM: CT ABDOMEN AND PELVIS WITH CONTRAST TECHNIQUE: Multidetector CT imaging of the abdomen and pelvis was performed using the standard protocol following bolus administration of intravenous contrast. CONTRAST:  100 cc Isovue 300 IV COMPARISON:  None. FINDINGS: Lower chest: Lung bases are clear. No effusions. Heart is normal size. Hepatobiliary: No focal hepatic abnormality. Gallbladder unremarkable. Pancreas: No focal abnormality or ductal dilatation. Spleen: No focal abnormality.  Normal size. Adrenals/Urinary Tract: No adrenal abnormality. No focal renal abnormality. No stones or hydronephrosis. Urinary bladder is unremarkable. Stomach/Bowel: Stomach, large and small bowel grossly unremarkable. Vascular/Lymphatic: No evidence of  aneurysm or adenopathy. Reproductive: No visible focal abnormality. Other: No free fluid or free air. Musculoskeletal: Comminuted fracture noted through the proximal right femur as seen on plain film. No additional acute bony abnormality. IMPRESSION: Comminuted proximal right femoral fracture. No acute intra-abdominal abnormality. Electronically Signed   By: Charlett Nose M.D.   On: 11/07/2015 14:24   Pelvis Portable  Result Date: 11/08/2015 CLINICAL DATA:  Postoperative radiograph, status post internal fixation of right femoral fracture. Initial encounter. EXAM: PORTABLE PELVIS 1-2 VIEWS COMPARISON:  Intraoperative images performed 11/07/2015 FINDINGS: An intramedullary rod and screw are partially imaged at the proximal right femur, transfixing the subtrochanteric and intertrochanteric fracture of the proximal right femur in near anatomic alignment. No new fractures are seen. A nondisplaced fracture through the right inferior pubic ramus is again noted. Overlying postoperative soft tissue air is noted. The left hip is unremarkable in appearance. Contrast  is noted filling the bladder. The visualized bowel gas pattern is grossly unremarkable. IMPRESSION: Status post internal fixation of subtrochanteric and intertrochanteric fracture of the proximal right femur in near anatomic alignment. No new fracture seen. Nondisplaced fracture through the right inferior pubic ramus again noted. Electronically Signed   By: Roanna Raider M.D.   On: 11/08/2015 03:41   Dg Pelvis Portable  Result Date: 11/07/2015 CLINICAL DATA:  20 year old male with pelvic pain following fall. Initial encounter. EXAM: PORTABLE PELVIS 1-2 VIEWS COMPARISON:  None. FINDINGS: An oblique fracture of the proximal right femur with apex lateral angulation noted. This fracture extends inferiorly and laterally from the lesser trochanter. A probable nondisplaced fracture of the right inferior pubic ramus is noted. No subluxation, dislocation or  diastases noted. IMPRESSION: Proximal right femur fracture as described. Question nondisplaced right inferior pubic ramus fracture. Electronically Signed   By: Harmon Pier M.D.   On: 11/07/2015 13:26   Ct Femur Right Wo Contrast  Result Date: 11/07/2015 CLINICAL DATA:  Fall from roof today.  Right femur fracture. EXAM: CT OF THE RIGHT FEMUR WITHOUT CONTRAST TECHNIQUE: Multidetector CT imaging was performed according to the standard protocol. Multiplanar CT image reconstructions were also generated. COMPARISON:  Pelvic and right hip radiograph same date. FINDINGS: Bones/Joint/Cartilage There is a comminuted subtrochanteric femur fracture with intertrochanteric extension medially. There is medial displacement of the lesser trochanter. This fracture is mildly displaced posteriorly and laterally. There is apex lateral angulation and a 9.8 cm butterfly fragment posteriorly. The greater trochanter and femoral neck are intact. The femoral head is intact and located. There is a nondisplaced fracture of the right inferior pubic ramus. The superior pubic ramus appears intact. The distal right femur appears intact. Ligaments Not relevant for exam/indication. Muscles and Tendons Contusion within the quadriceps musculature proximally. No large hematoma or other focal fluid collection identified. No evidence of tendon rupture by CT. Soft Tissues None. IMPRESSION: 1. Comminuted subtrochanteric right femur fracture with medial intertrochanteric extension. This fracture is mildly displaced and angulated. 2. Nondisplaced fracture of the right inferior pubic ramus. Electronically Signed   By: Carey Bullocks M.D.   On: 11/07/2015 17:18   Dg Chest Portable 1 View  Result Date: 11/07/2015 CLINICAL DATA:  Fall off roof EXAM: PORTABLE CHEST 1 VIEW COMPARISON:  None. FINDINGS: Lungs are clear.  No pleural effusion or pneumothorax. The heart is normal in size. Visualized osseous structures are within normal limits. IMPRESSION: No  evidence of acute cardiopulmonary disease. Electronically Signed   By: Charline Bills M.D.   On: 11/07/2015 13:25   Dg C-arm 61-120 Min  Result Date: 11/08/2015 CLINICAL DATA:  Internal fixation of right femoral fracture. Initial encounter. EXAM: RIGHT FEMUR 2 VIEWS; DG C-ARM 61-120 MIN COMPARISON:  Right femur radiographs and CT performed 11/07/2015 FINDINGS: Nine fluoroscopic C-arm images are provided from the OR. These demonstrate placement of an intramedullary rod and screws through the patient's right femoral subtrochanteric and intertrochanteric fracture, seen in near anatomic alignment. There is mild residual displacement of the greater tuberosity fragment. No new fractures are seen. Overlying postoperative changes are noted. IMPRESSION: Status post internal fixation of right femoral subtrochanteric and intertrochanteric fracture in near anatomic alignment. Electronically Signed   By: Roanna Raider M.D.   On: 11/08/2015 03:04   Dg Hip Unilat With Pelvis 2-3 Views Right  Result Date: 11/07/2015 CLINICAL DATA:  Fall 20 feet, landing on right hip. EXAM: DG HIP (WITH OR WITHOUT PELVIS) 2-3V RIGHT COMPARISON:  None. FINDINGS: Comminuted  fracture noted through the proximal right femoral shaft involving the lesser trochanter. Varus angulation and displacement of fracture fragments. Fracture through the right inferior pubic ramus, nondisplaced. No subluxation or dislocation. IMPRESSION: Comminuted, angulated and displaced fracture through the proximal right femoral shaft involving the lesser trochanter. Nondisplaced fracture through the right inferior pubic ramus. Electronically Signed   By: Charlett NoseKevin  Dover M.D.   On: 11/07/2015 14:07   Dg Femur, Min 2 Views Right  Result Date: 11/11/2015 CLINICAL DATA:  History of fractured femur EXAM: RIGHT FEMUR 2 VIEWS COMPARISON:  Right femur films of 11/07/2015 FINDINGS: Fixation of the right femoral intertrochanteric and subtrochanteric fracture is seen. The intra  medullary nail is in good position. No complicating features are seen. IMPRESSION: Fixation of right femoral intertrochanteric and subtrochanteric femoral fracture. Electronically Signed   By: Dwyane DeePaul  Barry M.D.   On: 11/11/2015 15:24   Dg Femur, Min 2 Views Right  Result Date: 11/08/2015 CLINICAL DATA:  Internal fixation of right femoral fracture. Initial encounter. EXAM: RIGHT FEMUR 2 VIEWS; DG C-ARM 61-120 MIN COMPARISON:  Right femur radiographs and CT performed 11/07/2015 FINDINGS: Nine fluoroscopic C-arm images are provided from the OR. These demonstrate placement of an intramedullary rod and screws through the patient's right femoral subtrochanteric and intertrochanteric fracture, seen in near anatomic alignment. There is mild residual displacement of the greater tuberosity fragment. No new fractures are seen. Overlying postoperative changes are noted. IMPRESSION: Status post internal fixation of right femoral subtrochanteric and intertrochanteric fracture in near anatomic alignment. Electronically Signed   By: Roanna RaiderJeffery  Chang M.D.   On: 11/08/2015 03:04   Dg Femur Min 2 Views Right  Result Date: 11/07/2015 CLINICAL DATA:  Initial evaluation for acute trauma, fall. EXAM: RIGHT FEMUR 2 VIEWS COMPARISON:  None. FINDINGS: External traction device in place. There is an acute mildly comminuted oblique fracture through the intertrochanteric region of the left hip with sub trochanteric extension. Fracture line oriented longitudinally into the long axis of the femur. Slight medial and posterior displacement. Right femoral head normally position within the acetabulum. No discrete acetabular fracture identified. Linear lucency through the right inferior pubic ramus suspicious for possible fracture as well. Pubis symphysis approximated. Visualized SI joints approximated. The IMPRESSION: 1. Acute comminuted oblique intertrochanteric right femoral fracture. 2. Linear lucency through the right inferior pubic ramus,  suspicious for acute nondisplaced fracture. Electronically Signed   By: Rise MuBenjamin  McClintock M.D.   On: 11/07/2015 14:15    Disposition: 01-Home or Self Care Post op medication provided Pt has 2 week post op visit with Dr. Tiburcio PeaHarris Pts mom understands that if they require anything further to contact our clinic.    Follow-up Information    Alvy BealBROOKS,DAHARI D, MD .   Specialty:  Orthopedic Surgery Contact information: [address] [place] [address] [phone]            Signed: Kirt BoysMayo, Mivaan Corbitt Christina 11/17/2015, 12:06 PM

## 2015-11-21 ENCOUNTER — Encounter (HOSPITAL_COMMUNITY): Payer: Self-pay | Admitting: Emergency Medicine

## 2017-08-01 IMAGING — CR DG TIBIA/FIBULA 2V*R*
4 series · 4 of 4 positions shown · non-contrast
Comparison: None.

CLINICAL DATA: Initial evaluation for acute trauma, fall.

EXAM:
RIGHT TIBIA AND FIBULA - 2 VIEW

[tibia ap (1 of 2)]
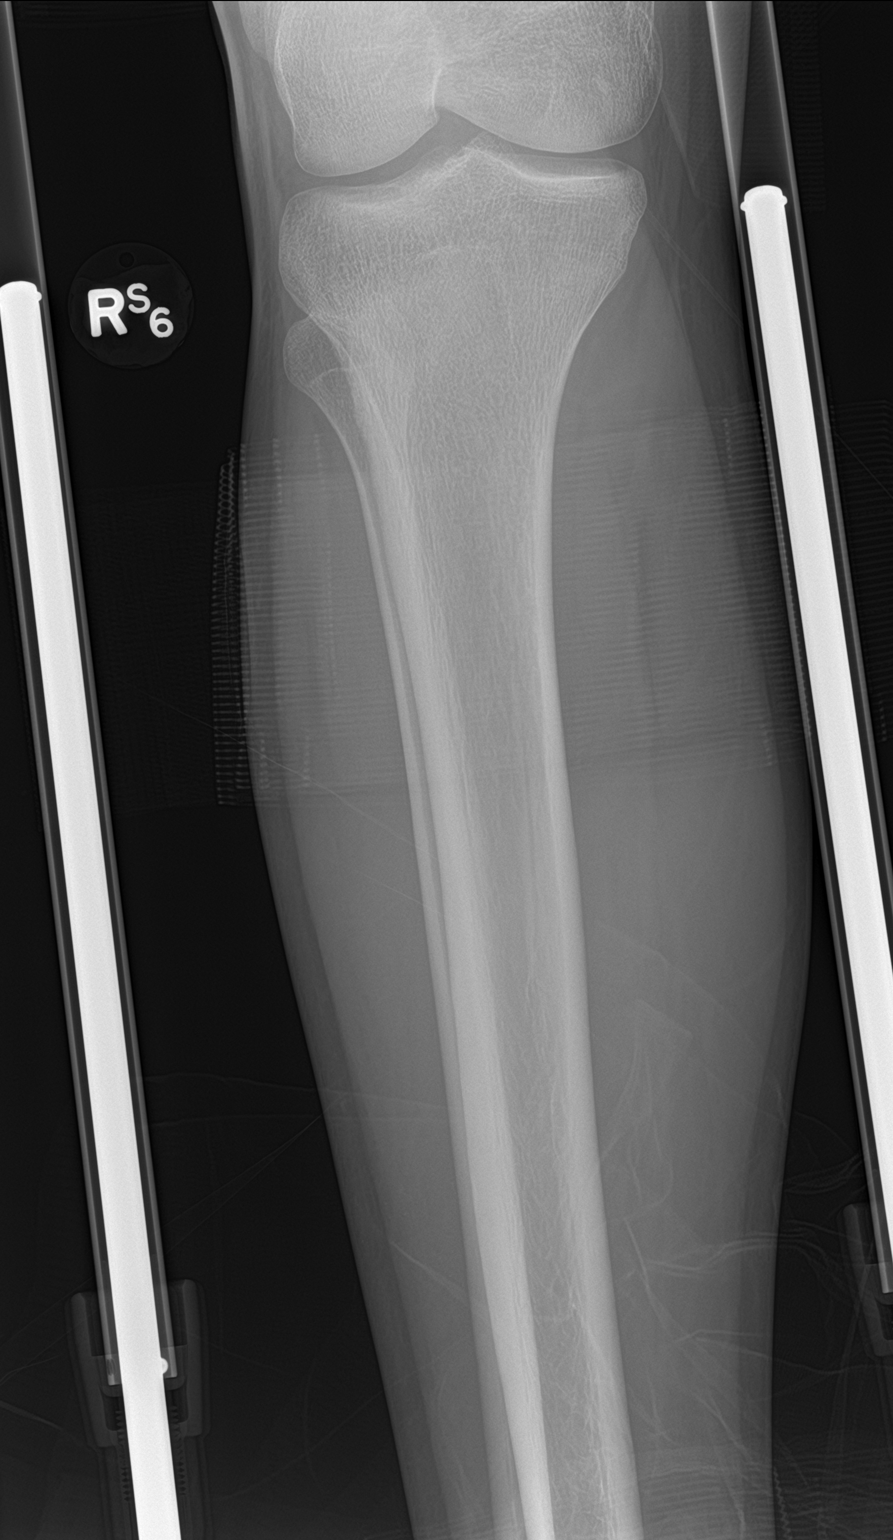

[tibia ap (2 of 2)]
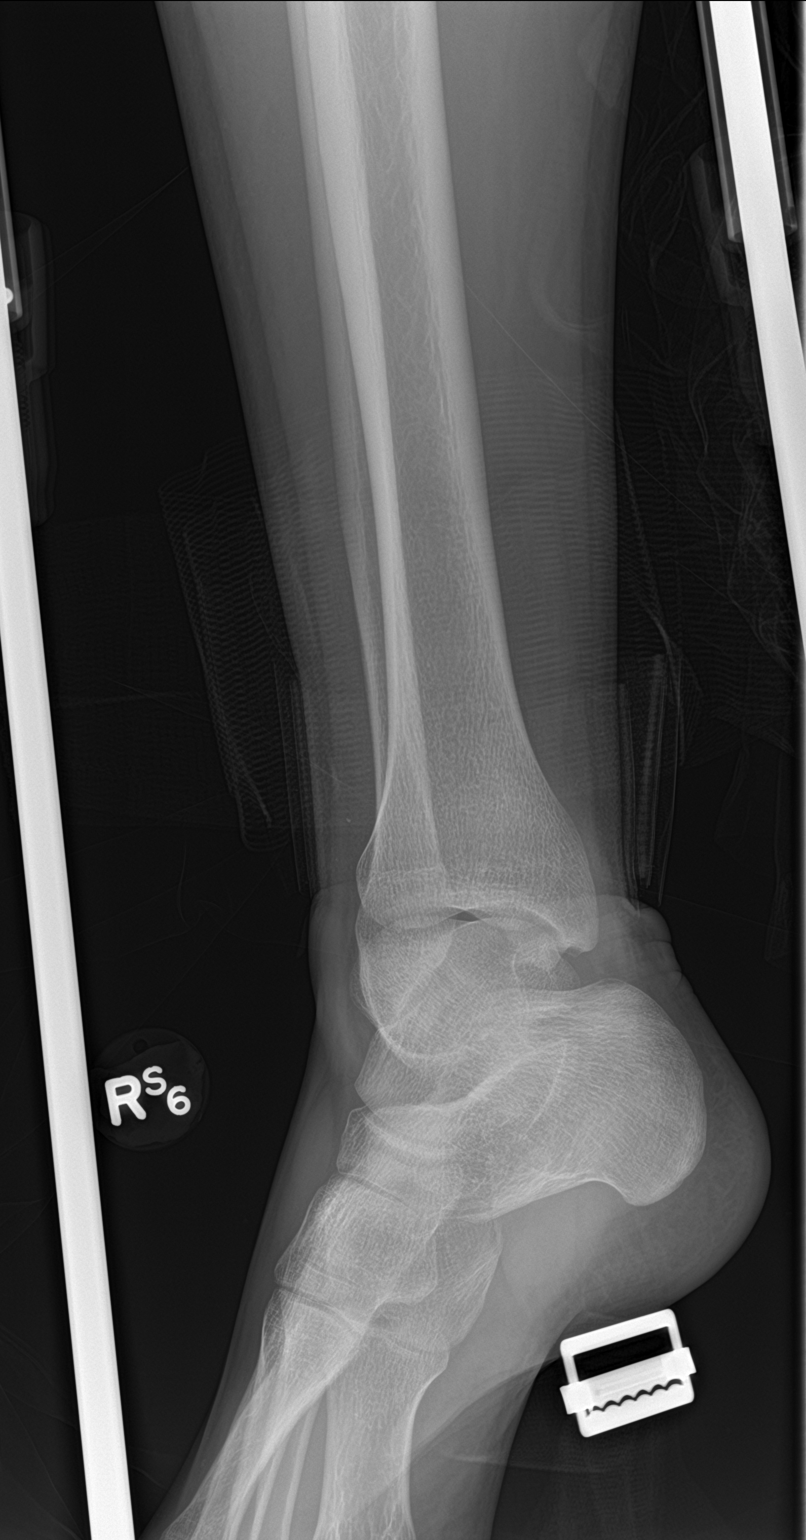

[tibia lat (1 of 2)]
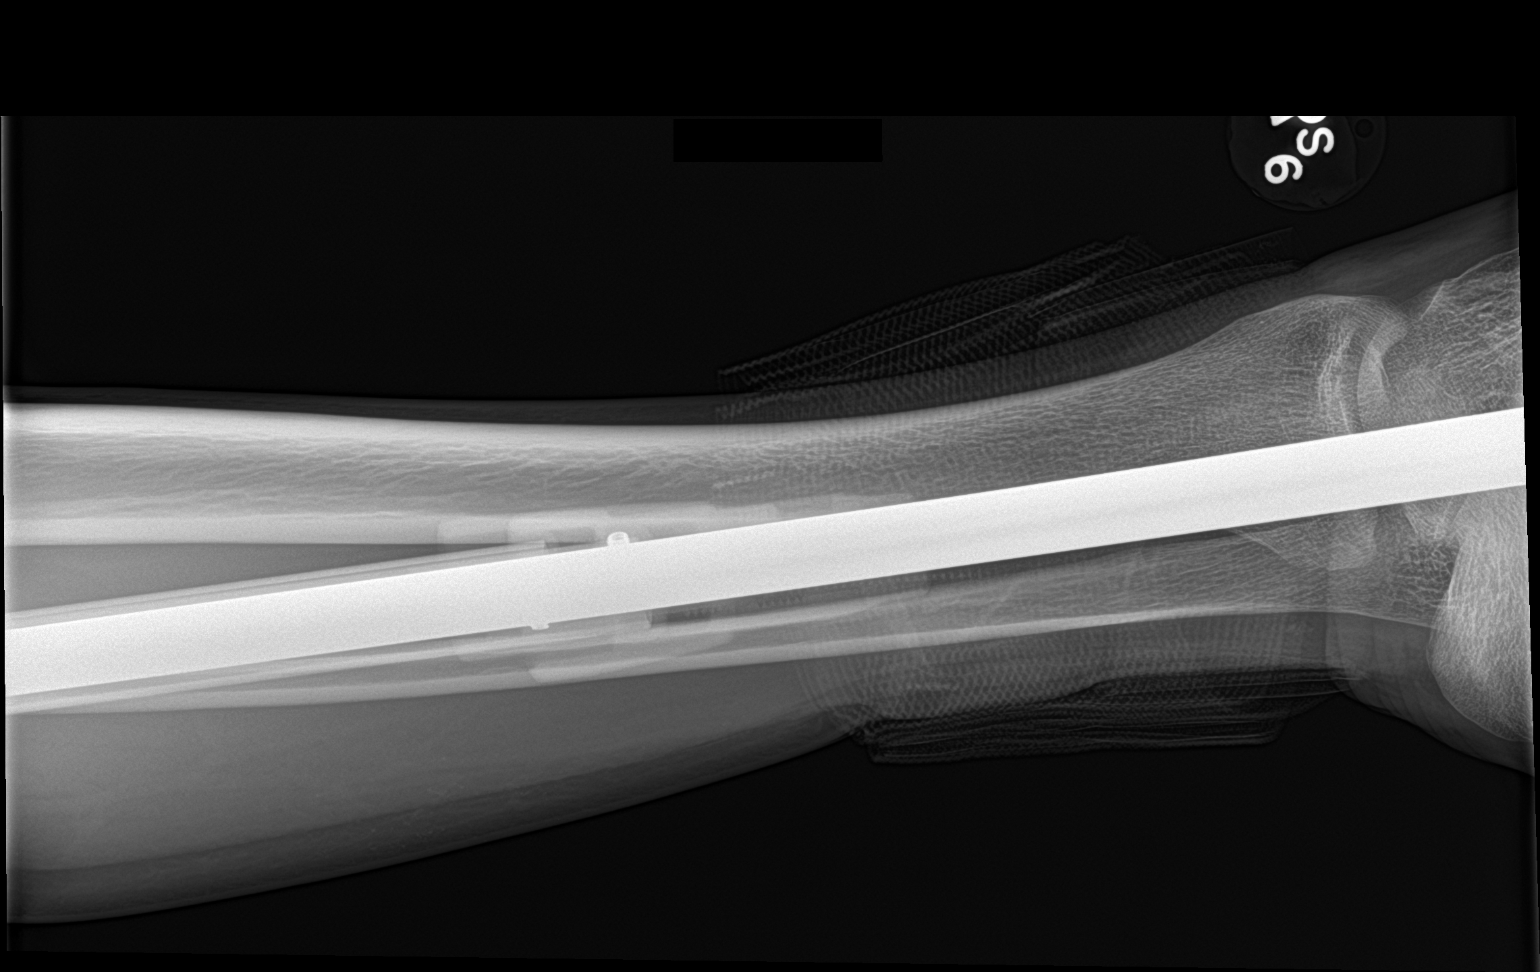

[tibia lat (2 of 2)]
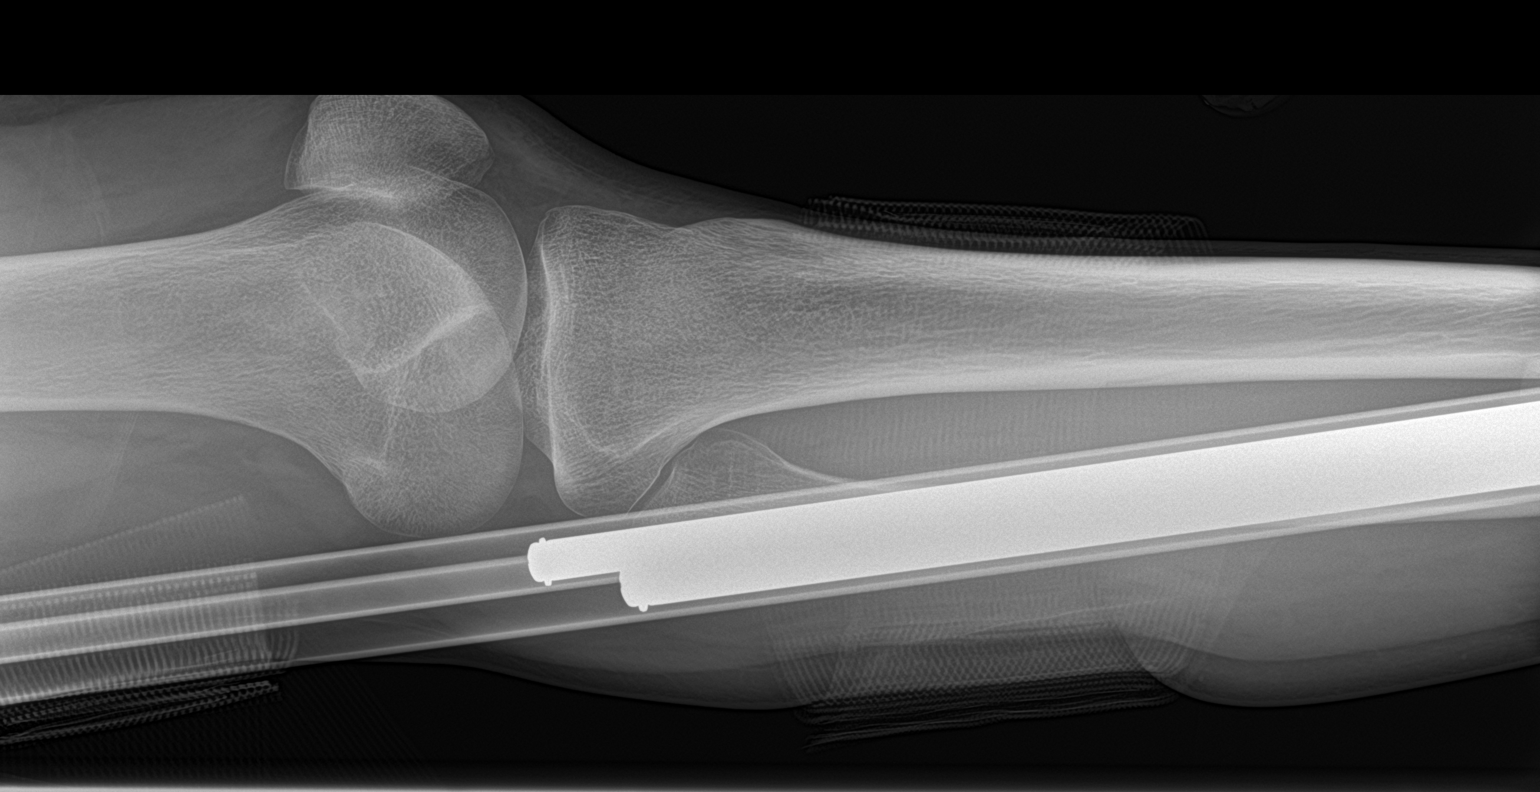

[4 of 4 positions shown; findings below may reference images not displayed]

FINDINGS: There is no evidence of fracture or other focal bone lesions. Soft
tissues are unremarkable. External traction device in place.
IMPRESSION: Negative.

## 2017-08-05 IMAGING — DX DG FEMUR 2+V*R*
4 series · 4 of 4 positions shown · non-contrast
Comparison: Right femur films of 11/07/2015

CLINICAL DATA: History of fractured femur

EXAM:
RIGHT FEMUR 2 VIEWS

[femur ap (1 of 2)]
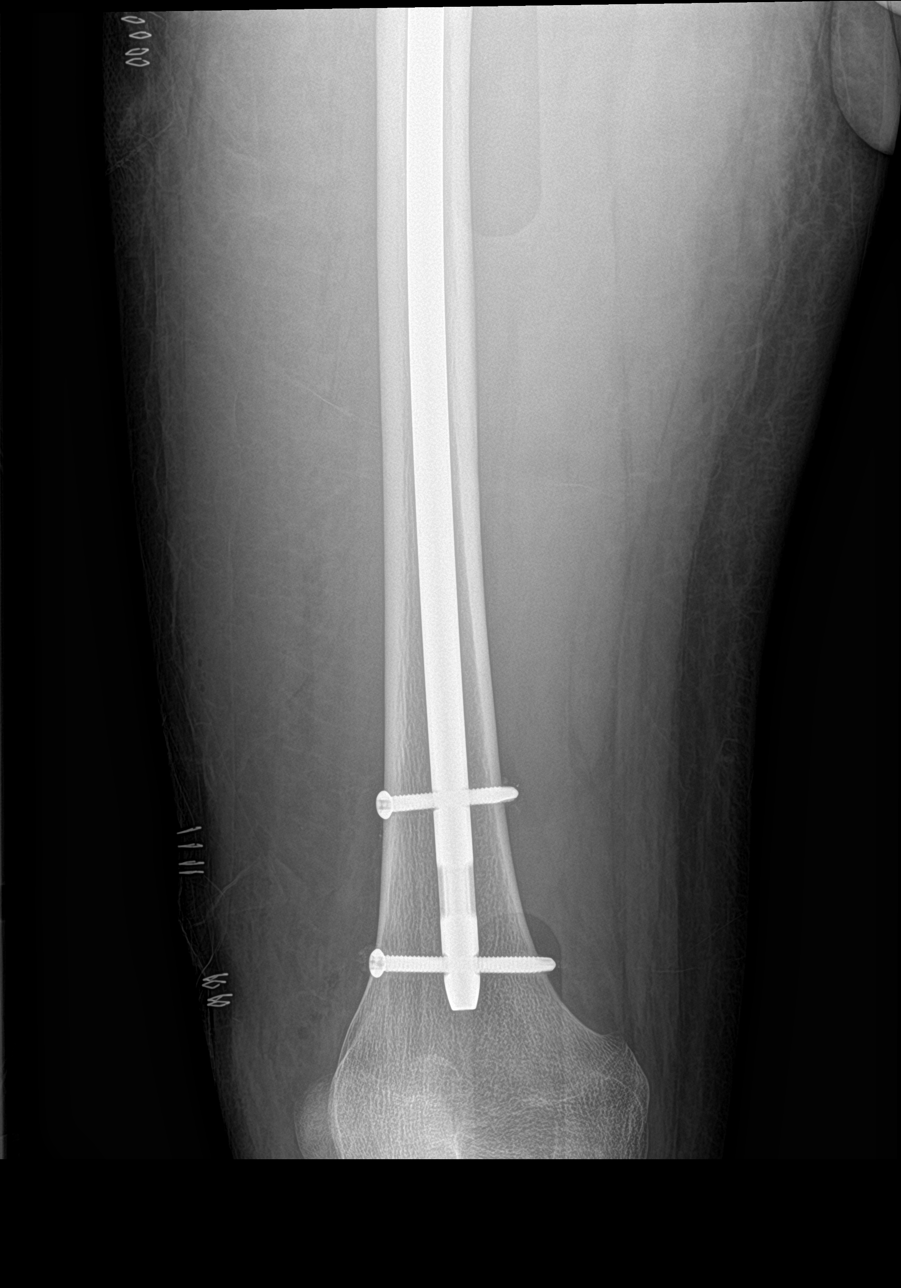

[femur ap (2 of 2)]
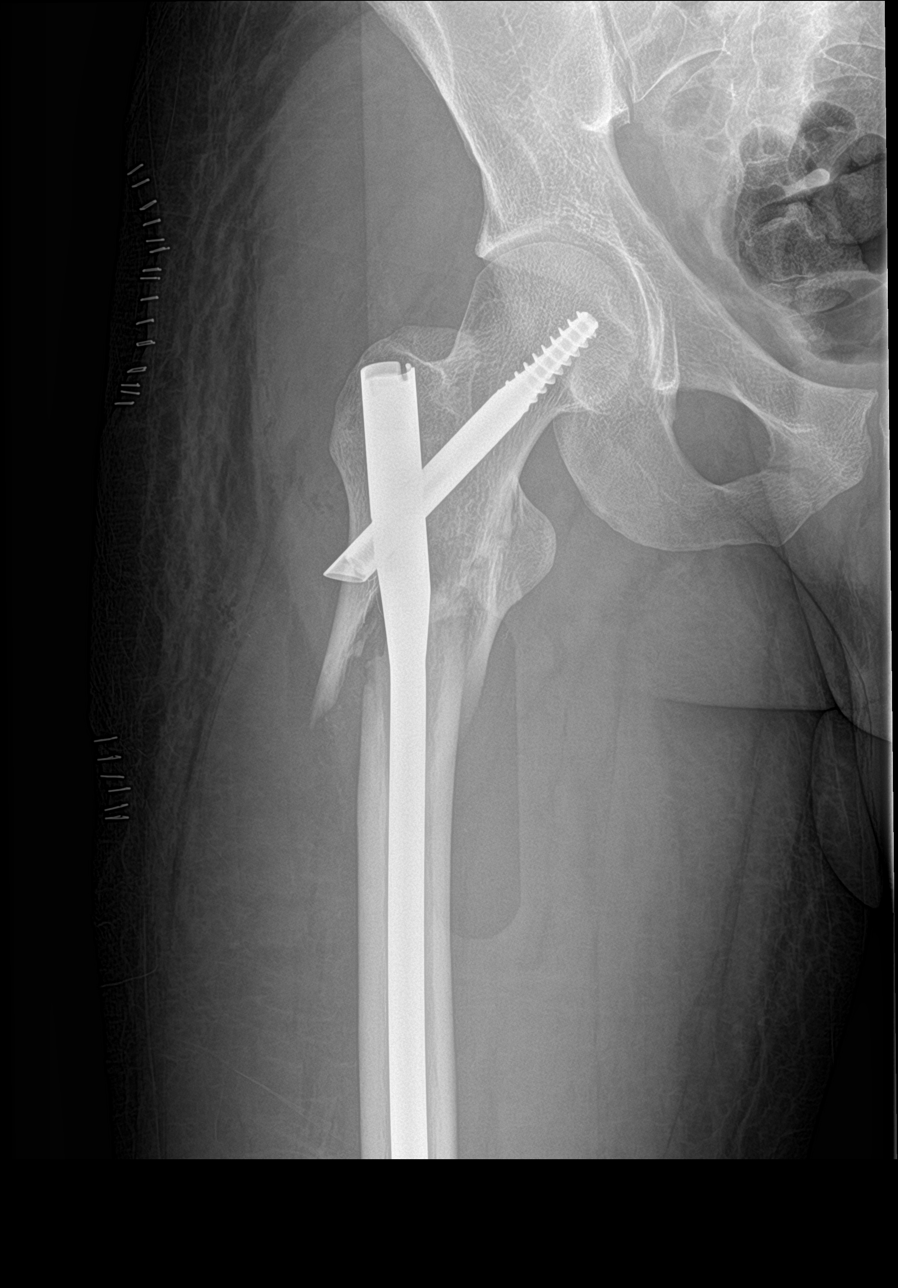

[femur lat (1 of 2)]
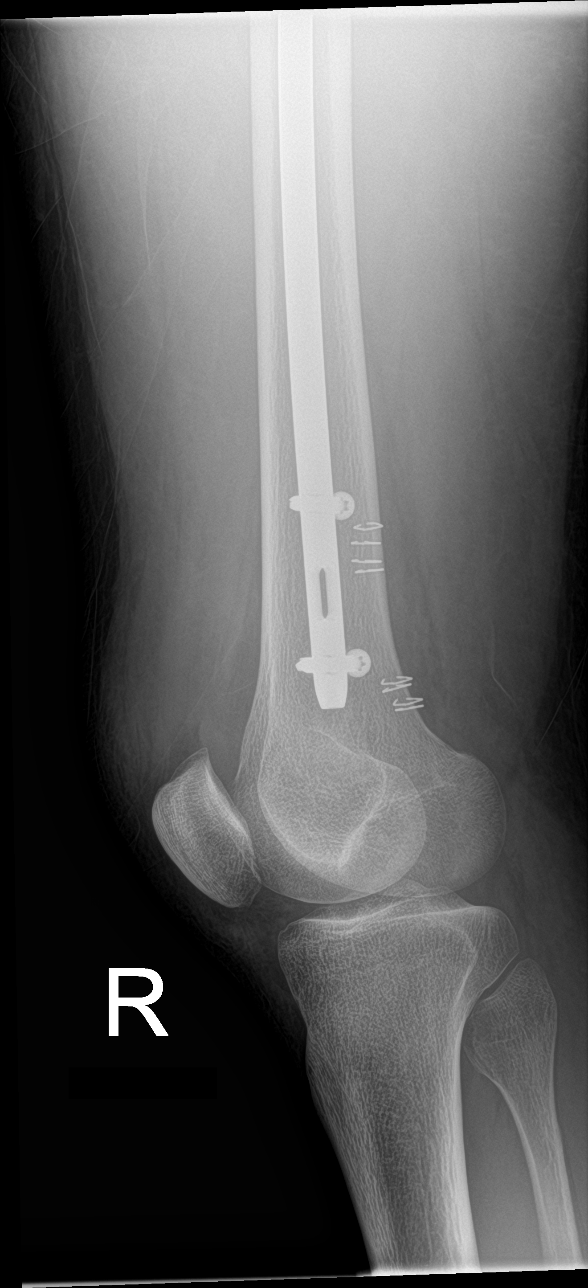

[femur lat (2 of 2)]
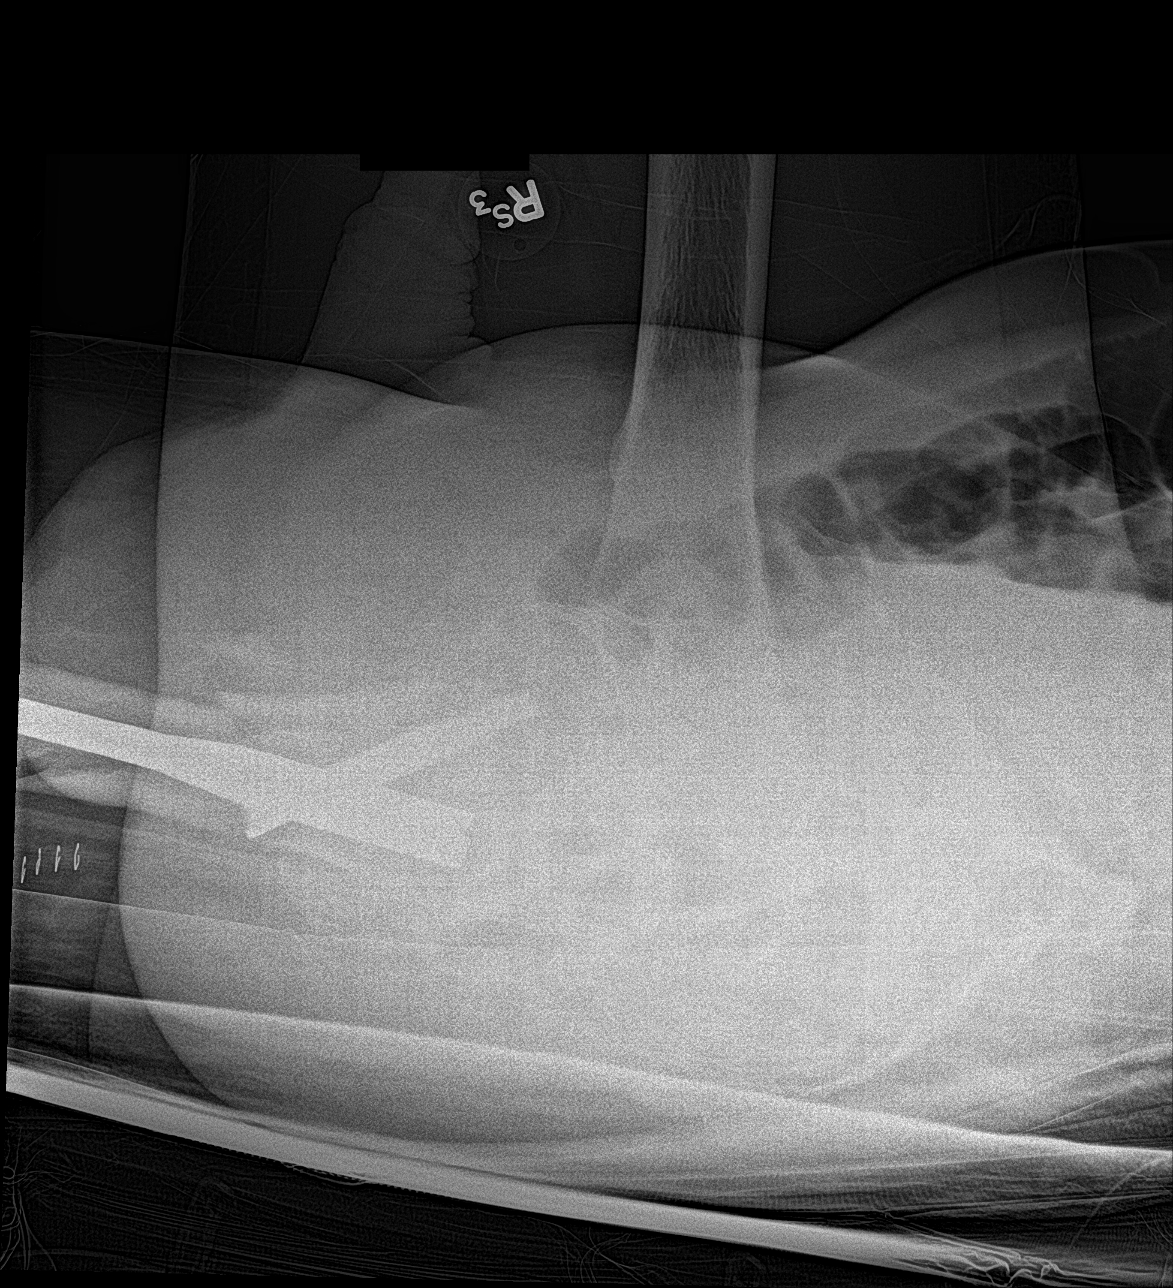

[4 of 4 positions shown; findings below may reference images not displayed]

FINDINGS: Fixation of the right femoral intertrochanteric and subtrochanteric
fracture is seen. The intra medullary nail is in good position. No
complicating features are seen.
IMPRESSION: Fixation of right femoral intertrochanteric and subtrochanteric
femoral fracture.
